# Patient Record
Sex: Female | Born: 1955 | Race: White | Hispanic: No | Smoking: Current every day smoker
Health system: Southern US, Community
[De-identification: ages and names within clinical notes are randomized; demographics above are authoritative.]

## PROBLEM LIST (undated history)

## (undated) DIAGNOSIS — F909 Attention-deficit hyperactivity disorder, unspecified type: Secondary | ICD-10-CM

## (undated) DIAGNOSIS — F319 Bipolar disorder, unspecified: Secondary | ICD-10-CM

## (undated) HISTORY — PX: BREAST BIOPSY: SHX20

## (undated) HISTORY — PX: TUBAL LIGATION: SHX77

## (undated) HISTORY — PX: ABDOMINAL HYSTERECTOMY: SHX81

## (undated) HISTORY — PX: CHOLECYSTECTOMY: SHX55

---

## 2002-12-01 ENCOUNTER — Emergency Department (HOSPITAL_COMMUNITY): Admission: EM | Admit: 2002-12-01 | Discharge: 2002-12-01 | Payer: Self-pay | Admitting: Emergency Medicine

## 2002-12-07 ENCOUNTER — Emergency Department (HOSPITAL_COMMUNITY): Admission: EM | Admit: 2002-12-07 | Discharge: 2002-12-07 | Payer: Self-pay | Admitting: Emergency Medicine

## 2002-12-30 ENCOUNTER — Inpatient Hospital Stay (HOSPITAL_COMMUNITY): Admission: AD | Admit: 2002-12-30 | Discharge: 2003-01-01 | Payer: Self-pay | Admitting: *Deleted

## 2003-07-09 ENCOUNTER — Emergency Department (HOSPITAL_COMMUNITY): Admission: EM | Admit: 2003-07-09 | Discharge: 2003-07-10 | Payer: Self-pay | Admitting: *Deleted

## 2003-07-21 ENCOUNTER — Ambulatory Visit (HOSPITAL_COMMUNITY): Admission: RE | Admit: 2003-07-21 | Discharge: 2003-07-21 | Payer: Self-pay | Admitting: Pulmonary Disease

## 2003-08-05 ENCOUNTER — Ambulatory Visit (HOSPITAL_COMMUNITY): Admission: RE | Admit: 2003-08-05 | Discharge: 2003-08-05 | Payer: Self-pay | Admitting: General Surgery

## 2004-10-11 ENCOUNTER — Ambulatory Visit (HOSPITAL_COMMUNITY): Admission: RE | Admit: 2004-10-11 | Discharge: 2004-10-11 | Payer: Self-pay | Admitting: General Surgery

## 2005-01-08 ENCOUNTER — Ambulatory Visit: Payer: Self-pay | Admitting: Psychiatry

## 2005-01-08 ENCOUNTER — Inpatient Hospital Stay (HOSPITAL_COMMUNITY): Admission: EM | Admit: 2005-01-08 | Discharge: 2005-01-11 | Payer: Self-pay | Admitting: Psychiatry

## 2005-01-18 ENCOUNTER — Ambulatory Visit: Payer: Self-pay | Admitting: Psychiatry

## 2006-06-19 ENCOUNTER — Emergency Department (HOSPITAL_COMMUNITY): Admission: EM | Admit: 2006-06-19 | Discharge: 2006-06-19 | Payer: Self-pay | Admitting: Emergency Medicine

## 2007-04-08 ENCOUNTER — Ambulatory Visit (HOSPITAL_COMMUNITY): Admission: RE | Admit: 2007-04-08 | Discharge: 2007-04-08 | Payer: Self-pay | Admitting: General Surgery

## 2008-08-05 ENCOUNTER — Ambulatory Visit (HOSPITAL_COMMUNITY): Admission: RE | Admit: 2008-08-05 | Discharge: 2008-08-05 | Payer: Self-pay | Admitting: Pulmonary Disease

## 2008-09-02 ENCOUNTER — Emergency Department (HOSPITAL_COMMUNITY): Admission: EM | Admit: 2008-09-02 | Discharge: 2008-09-02 | Payer: Self-pay | Admitting: Emergency Medicine

## 2009-08-03 ENCOUNTER — Ambulatory Visit (HOSPITAL_COMMUNITY): Admission: RE | Admit: 2009-08-03 | Discharge: 2009-08-03 | Payer: Self-pay | Admitting: Pulmonary Disease

## 2009-08-11 ENCOUNTER — Ambulatory Visit (HOSPITAL_COMMUNITY): Admission: RE | Admit: 2009-08-11 | Discharge: 2009-08-11 | Payer: Self-pay | Admitting: Pulmonary Disease

## 2010-01-30 ENCOUNTER — Encounter: Payer: Self-pay | Admitting: General Surgery

## 2010-01-31 ENCOUNTER — Encounter: Payer: Self-pay | Admitting: Pulmonary Disease

## 2010-01-31 IMAGING — CR DG SHOULDER 2+V*R*
3 series · 3 of 3 positions shown · non-contrast
Comparison: None

CLINICAL DATA: Right shoulder pain for 1 week.

RIGHT SHOULDER - 2+ VIEW

[view not recorded (1 of 3)]
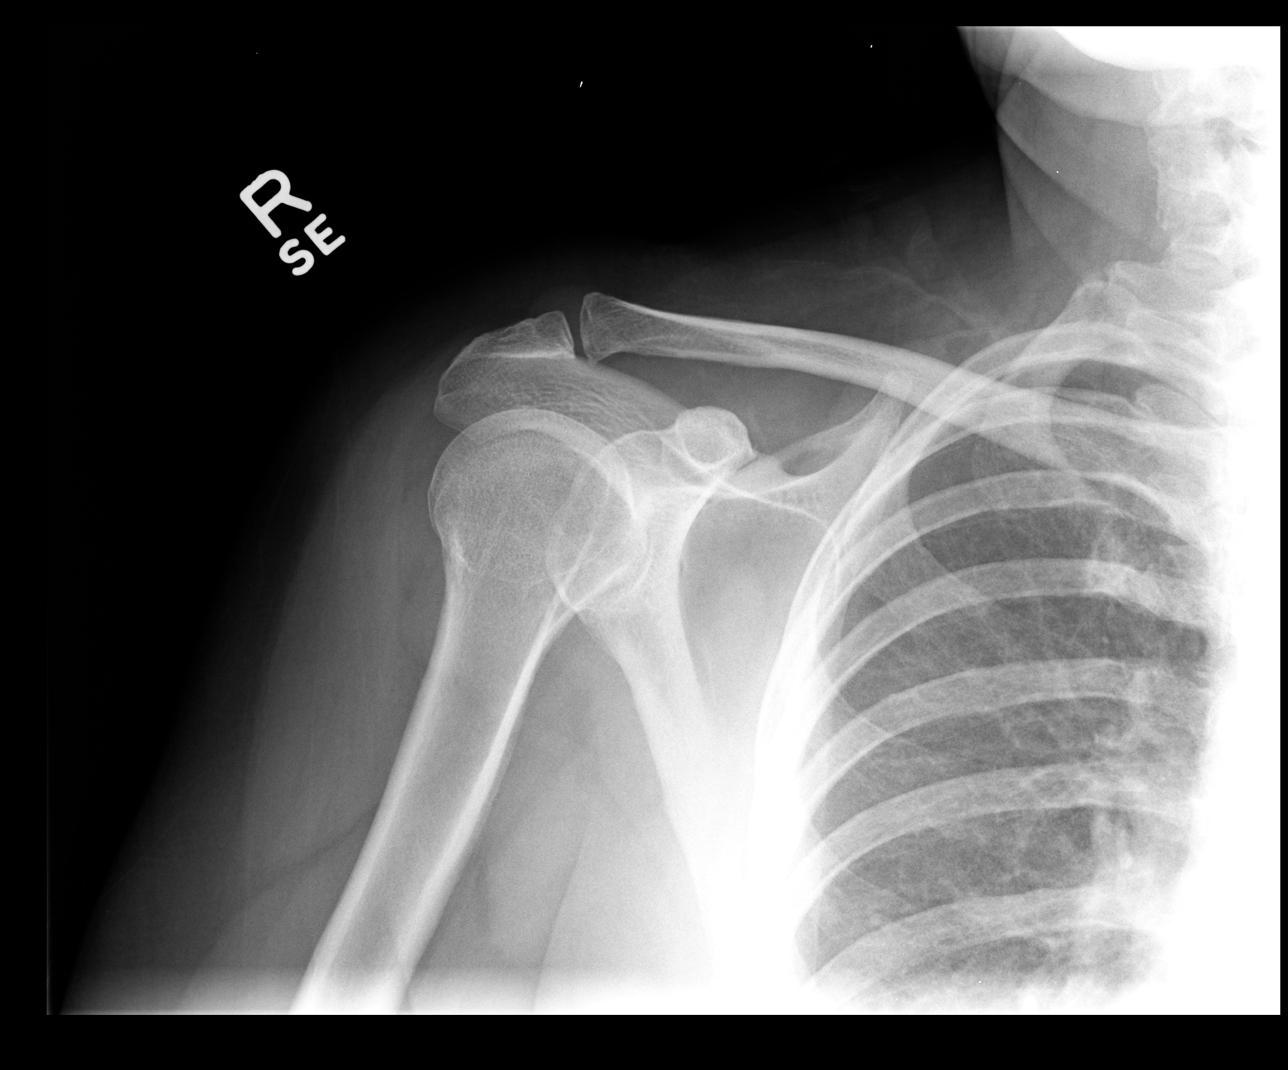

[view not recorded (2 of 3)]
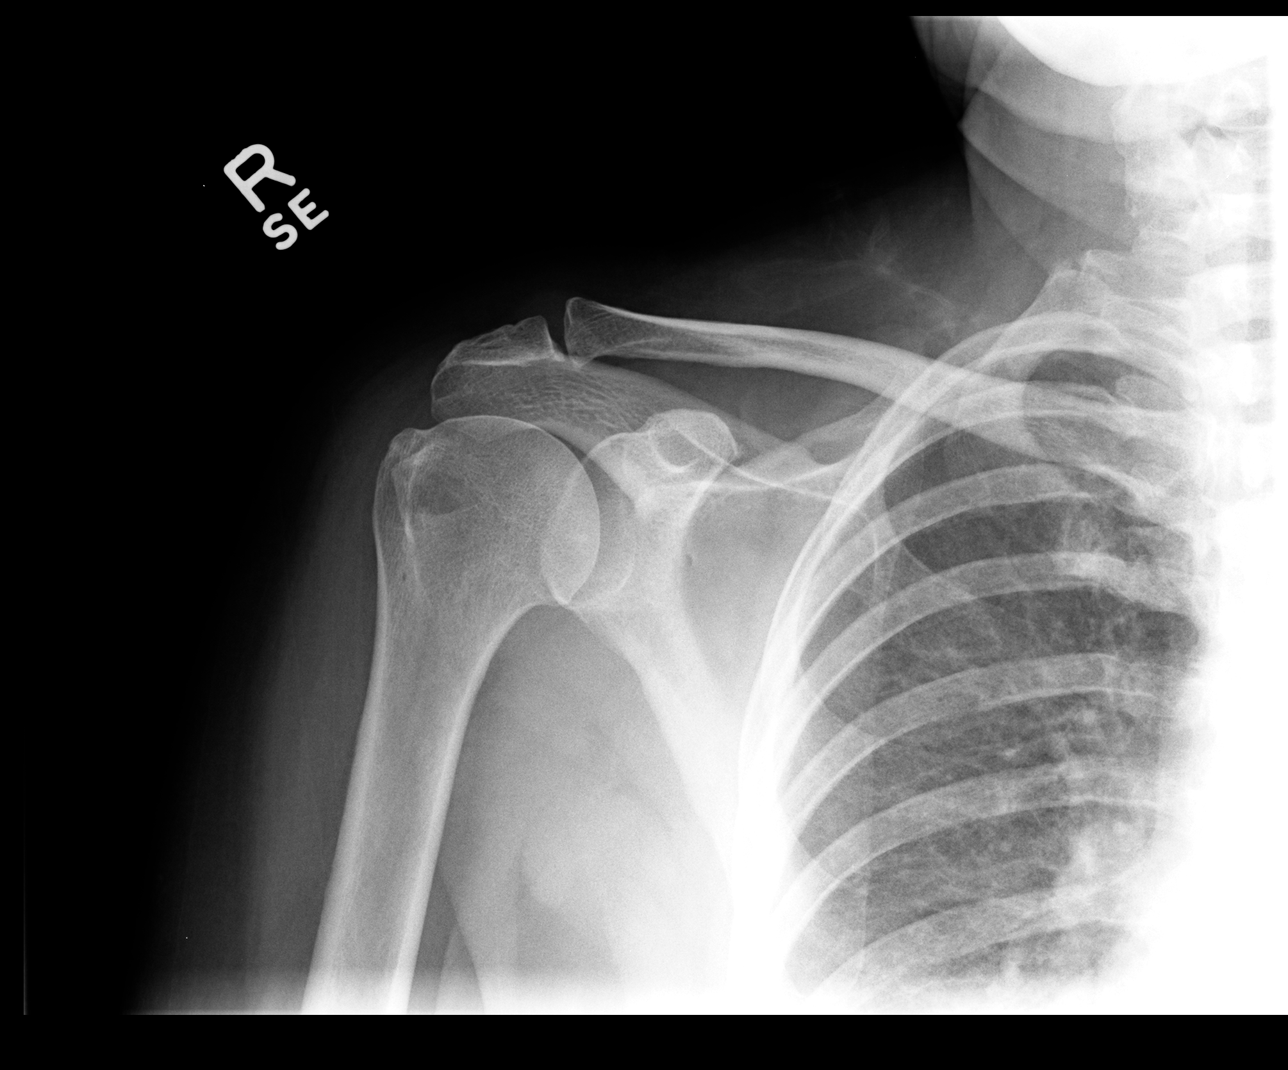

[view not recorded (3 of 3)]
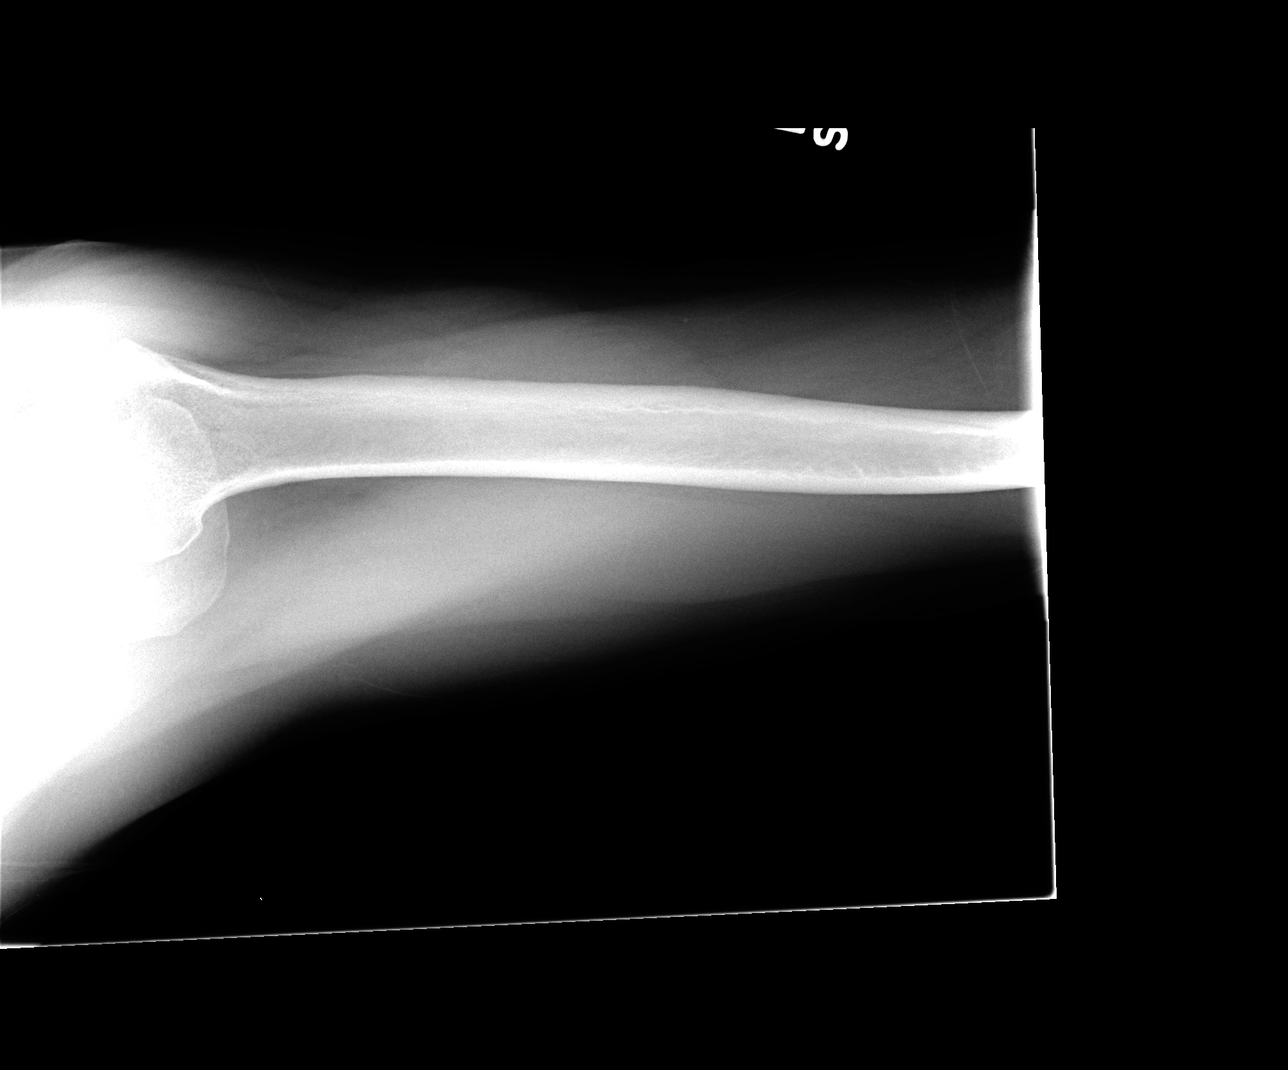

[3 of 3 positions shown; findings below may reference images not displayed]

FINDINGS: Faint lucency along the lesser tuberosity on the external
rotation view is most likely to be degenerative but could possibly
represent a small enchondroma.  This area of lucency measures
approximately 1.4 x 0.9 cm.

Glenohumeral alignment appears normal.  There is mild inferior
spurring of the acromioclavicular articulation.
IMPRESSION: 1.  Lucent lesion in the vicinity of the lesser tuberosity is
probably degenerative.
2.  No acute findings.  If symptoms persists despite conservative
therapy, MRI followup may be warranted.

## 2010-05-19 ENCOUNTER — Other Ambulatory Visit (HOSPITAL_COMMUNITY): Payer: Self-pay | Admitting: Pulmonary Disease

## 2010-05-19 ENCOUNTER — Ambulatory Visit (HOSPITAL_COMMUNITY)
Admission: RE | Admit: 2010-05-19 | Discharge: 2010-05-19 | Disposition: A | Discharge status: Home / Self Care | Payer: Medicaid Other | Source: Ambulatory Visit | Attending: Pulmonary Disease | Admitting: Pulmonary Disease

## 2010-05-19 DIAGNOSIS — M25549 Pain in joints of unspecified hand: Secondary | ICD-10-CM | POA: Insufficient documentation

## 2010-05-19 DIAGNOSIS — R52 Pain, unspecified: Secondary | ICD-10-CM
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
## 2010-05-27 NOTE — H&P (Signed)
NAMEDORRIE, COCUZZA NO.:  [id number]   MEDICAL RECORD NO.:  [id number]          PATIENT TYPE:  IPS   LOCATION:  0502                          [facility]   PHYSICIAN:  Jeanice Lim, M.D. DATE OF BIRTH:  09-09-1955   DATE OF ADMISSION:  01/08/2005  DATE OF DISCHARGE:                         PSYCHIATRIC ADMISSION ASSESSMENT   IDENTIFYING INFORMATION:  This is a 55 year old married white female who was  admitted voluntarily to the [facility] on January 07, 2005.   HISTORY OF PRESENT ILLNESS:  This patient presented to [facility] Emergency  Department after taking an overdose of 10 mg of Xanax and 50 mg of Valium in  addition to approximately 10 ounces of alcohol.  The patient reports that  she vomited after ingestion of the pills.  She denies having had suicidal  ideation.  She reports increased stressors at home, having to care for her  83 year old grandson and her husband as well as a daughter who is living in  the home now who has a drug habit.  The patient reports that she has been  depressed since the death of her mother approximately 4 years ago, with  symptoms of being sad, irritable, poor energy, anhedonia, isolation, poor  sleep, decreased appetite.  She reports that after the death of her mother  she took Zoloft, unknown dosage, for approximately 6 months with relief, and  after feeling better she tapered herself off of the medication.  She denies  any current homicidal ideation, denies any auditory or visual hallucinations  or signs or symptoms of mania.   PAST PSYCHIATRIC HISTORY:  This is her first admission to the [facility].  She has no previous inpatient or outpatient psychiatric  following, other than having been put on the Zoloft by her primary care  physician after the death of her mother.   SOCIAL HISTORY:  She lives with her husband of 14 years and her 29 year old  grandson and her 6 year old daughter who  will soon be moving out.  She is  currently unemployed.  She previously worked at a [employer], pressing  clothes.  She has a 7th grade education.  She denies any current legal  problems, and she describes her husband as her sole social support system.   FAMILY HISTORY:  She denies knowledge of any mental illness.  She reports  that her father was an alcohol abuser and her daughter has a cocaine and  heroin habit.   ALCOHOL AND DRUG HISTORY:  She says that she no longer drinks, for  approximately 3 years after gaining custody of her 5 year old grandson.  Prior to that she would drink approximately 3 beers a day on weekends and  she has tried marijuana in the past.   PAST MEDICAL HISTORY:  Her primary care Zondra Lawlor is Dr. Juanetta Gosling in  [place], [place].  Her medical problems include irritable bowel  syndrome.  She had a hysterectomy in 2004, cholecystectomy in 2004.  She has  had two breast surgeries of unknown type, one in 1996 and one in 2004, and  she  is complaining of  lumbar back pain for the past 2 months. She smokes 3  packs of cigarettes a day.   MEDICATIONS:  Her medications include Xanax, we believe is 0.5 mg 3 times  daily, Valium 5 mg twice a day, and hydrocodone 5/325 daily but she reports  not haven taken any hydrocodone for the last 3 months.   DRUG ALLERGIES:  MORPHINE, CODEINE.   POSITIVE PHYSICAL FINDINGS:  Her physical examination was performed at  [facility] Emergency Department.  Here at the [facility], she is  a well-nourished, well-developed white female who looks older than her  stated age of 44 years.  Her recent vital signs, 97.8, pulse 88,  respirations 18, blood pressure 111/78.  She is 66.5 inches tall, 161  pounds.   LABORATORY DATA:  She has elevated white blood cell count of 11.1, elevated  platelet count of 425.  Her basic metabolic panel was within normal limits  except for a low glucose reading of 67.  Her blood alcohol  level was 97.  Her salicylate was less than 4, acetaminophen less than 10.  Her urine drug  screen was positive for barbiturates and benzodiazepines.  She has a  urinalysis and TSH pending.   MENTAL STATUS EXAM:  She is alert and oriented x4 with good eye contact and  a constricted affect.  Her appearance was disheveled and her behavior was  calm and cooperative.  Her speech was clear with even pace and tone.  Her  mood was depressed as she was tearful during the interview.  Her thought  process was coherent, without suicidal or homicidal ideation.  No evidence  of auditory or visual hallucinations or mania was observed.  Her  concentration was normal and memory is intact.  Her insight is fair and her  judgment is poor.   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  AXIS III:  Irritable bowel syndrome.  AXIS IV:  Moderate for problems with her primary support group, educational  and occupational problems and economic problems.  AXIS V:  Current global assessment of functioning of 34, the past year 55.   INITIAL PLAN OF CARE:  Initial plan is to admit the patient voluntarily.  We  will work to stabilize her to mood.  We will work to increase her coping  skills and decrease her stressors.  We will start her on Zoloft and have a  family session prior to discharge.  She will need referral for followup.   ESTIMATED LENGTH OF STAY:  5-7 days.      Yolande Jolly, PA      Jeanice Lim, M.D.  Electronically Signed    AHW/MEDQ  D:  01/08/2005  T:  01/08/2005  Job:  [id number]

## 2010-05-27 NOTE — Discharge Summary (Signed)
Samantha Huynh, Samantha Huynh                 ACCOUNT NO.:  [id number]   MEDICAL RECORD NO.:  [id number]          PATIENT TYPE:  IPS   LOCATION:  0502                          [facility]ILITY[facility]   PHYSICIAN:  Jeanice Lim, M.D. DATE OF BIRTH:  Feb 23, 1955   DATE OF ADMISSION:  01/08/2005  DATE OF DISCHARGE:  01/11/2005                                 DISCHARGE SUMMARY   IDENTIFYING DATA:  This is a 55 year old married Caucasian female presenting  to the [facility] ER, overdosed on Xanax and Valium and alcohol. Reported  having vomited after ingesting pills, increased stress at home.   MEDICATIONS:  1.  Xanax.  2.  Hydrochlorothiazide.  3.  Valium.   ALLERGIES:  MORPHINE and CODEINE.   No alcohol for three years; previously three beers a day. Tried marijuana in  the past. Denied other drug use.   PRIMARY CARE PHYSICIAN:  Dr. Juanetta Gosling in [place].   PHYSICAL AND NEUROLOGICAL EXAMINATION:  Within normal limits.   ROUTINE ADMISSION LABORATORY DATA:  Within normal limits.   MENTAL STATUS EXAM:  Alert and oriented. Good eye contact. Constricted  affect. Speech clear and even. Mood depressed, tearful. Thought processes  goal directed. Cognitively intact. Somewhat fidgety psychomotor agitated and  restless at times. Judgment and insight impaired.   ADMISSION DIAGNOSES:  AXIS I:  Depressive disorder, not otherwise specified.  AXIS II:  Deferred.  AXIS III:  Irritable bowel syndrome.  AXIS IV:  Moderate stressors with primary support group, education,  occupation, economic problems.  AXIS V:  34/55.   HOSPITAL COURSE:  The patient was admitted and ordered routine p.r.n.  medications. Started on Zoloft. Family session was requested, and patient  participated in individual, group, and milieu therapy. Protonix was started.  The patient was placed on detox protocol for safe detox and optimized on  medications targeting depressive symptoms and anxiety. The patient reported  a gradual positive  response to medications and resolution of withdraw  symptoms. Discharge in improved condition, euthymic, affect bright. No side  effects with medications. Given medication education. At the time of  discharge, there was no dangerous ideation including suicidal thought and  thinking was clear and mood was euthymic, affect bright, and aftercare plan  in place.   The patient was discharged after medication education on:  1.  Zoloft 50 mg 1-1/2 q.a.m.  2.  Trazodone 150 mg at 8 p.m.  3.  Ambien 10 mg nightly p.r.n. insomnia.   The patient's followup will be with Dr. Lolly Mustache at the [facility] in [facility] on January 9 at 10:15.   DISCHARGE DIAGNOSES:  AXIS I:  Depressive disorder, not otherwise specified.  AXIS II:  Deferred.  AXIS III:  Irritable bowel syndrome.  AXIS IV:  Moderate stressors with primary support group, education,  occupation, economic problems.  AXIS V:  55 to 60.      Jeanice Lim, M.D.  Electronically Signed     JEM/MEDQ  D:  02/09/2005  T:  02/10/2005  Job:  [id number]
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
## 2010-06-28 ENCOUNTER — Other Ambulatory Visit (HOSPITAL_COMMUNITY): Payer: Self-pay | Admitting: Pulmonary Disease

## 2010-06-28 DIAGNOSIS — Z139 Encounter for screening, unspecified: Secondary | ICD-10-CM

## 2010-08-08 ENCOUNTER — Ambulatory Visit (HOSPITAL_COMMUNITY): Payer: Medicaid Other

## 2010-09-01 ENCOUNTER — Ambulatory Visit (HOSPITAL_COMMUNITY): Payer: Medicaid Other

## 2012-04-30 ENCOUNTER — Other Ambulatory Visit (HOSPITAL_COMMUNITY): Payer: Self-pay | Admitting: General Surgery

## 2012-04-30 DIAGNOSIS — Z139 Encounter for screening, unspecified: Secondary | ICD-10-CM

## 2012-05-02 ENCOUNTER — Ambulatory Visit (HOSPITAL_COMMUNITY): Payer: Medicaid Other

## 2012-06-13 ENCOUNTER — Other Ambulatory Visit (HOSPITAL_COMMUNITY): Payer: Self-pay | Admitting: Pulmonary Disease

## 2012-06-13 DIAGNOSIS — R109 Unspecified abdominal pain: Secondary | ICD-10-CM

## 2012-06-13 DIAGNOSIS — R1110 Vomiting, unspecified: Secondary | ICD-10-CM

## 2012-06-13 DIAGNOSIS — R110 Nausea: Secondary | ICD-10-CM

## 2012-06-18 ENCOUNTER — Ambulatory Visit (HOSPITAL_COMMUNITY)
Admission: RE | Admit: 2012-06-18 | Discharge: 2012-06-18 | Disposition: A | Discharge status: Home / Self Care | Payer: Medicaid Other | Source: Ambulatory Visit | Attending: Pulmonary Disease | Admitting: Pulmonary Disease

## 2012-06-18 DIAGNOSIS — R112 Nausea with vomiting, unspecified: Secondary | ICD-10-CM | POA: Insufficient documentation

## 2012-06-18 DIAGNOSIS — R1110 Vomiting, unspecified: Secondary | ICD-10-CM

## 2012-06-18 DIAGNOSIS — R109 Unspecified abdominal pain: Secondary | ICD-10-CM | POA: Insufficient documentation

## 2012-06-18 DIAGNOSIS — R110 Nausea: Secondary | ICD-10-CM

## 2013-06-25 ENCOUNTER — Encounter (HOSPITAL_COMMUNITY): Payer: Self-pay | Admitting: Emergency Medicine

## 2013-06-25 ENCOUNTER — Emergency Department (HOSPITAL_COMMUNITY)
Admission: EM | Admit: 2013-06-25 | Discharge: 2013-06-25 | Disposition: A | Discharge status: Home / Self Care | Payer: Medicaid Other | Attending: Emergency Medicine | Admitting: Emergency Medicine

## 2013-06-25 DIAGNOSIS — B009 Herpesviral infection, unspecified: Secondary | ICD-10-CM | POA: Insufficient documentation

## 2013-06-25 DIAGNOSIS — B029 Zoster without complications: Secondary | ICD-10-CM

## 2013-06-25 DIAGNOSIS — F17200 Nicotine dependence, unspecified, uncomplicated: Secondary | ICD-10-CM | POA: Insufficient documentation

## 2013-06-25 DIAGNOSIS — Z79899 Other long term (current) drug therapy: Secondary | ICD-10-CM | POA: Insufficient documentation

## 2013-06-25 DIAGNOSIS — Z8659 Personal history of other mental and behavioral disorders: Secondary | ICD-10-CM | POA: Insufficient documentation

## 2013-06-25 HISTORY — DX: Attention-deficit hyperactivity disorder, unspecified type: F90.9

## 2013-06-25 HISTORY — DX: Bipolar disorder, unspecified: F31.9

## 2013-06-25 MED ORDER — VALACYCLOVIR HCL 500 MG PO TABS
1000.0000 mg | ORAL_TABLET | Freq: Once | ORAL | Status: AC
  Administered 2013-06-25: 1000 mg via ORAL
  Filled 2013-06-25: qty 2

## 2013-06-25 MED ORDER — VALACYCLOVIR HCL 1 G PO TABS: 1000.0000 mg | ORAL_TABLET | Freq: Three times a day (TID) | ORAL | Status: AC

## 2013-06-25 MED ORDER — OXYCODONE-ACETAMINOPHEN 5-325 MG PO TABS
2.0000 | ORAL_TABLET | Freq: Once | ORAL | Status: AC
  Administered 2013-06-25: 2 via ORAL
  Filled 2013-06-25: qty 2

## 2013-06-25 MED ORDER — OXYCODONE-ACETAMINOPHEN 5-325 MG PO TABS: 2.0000 | ORAL_TABLET | ORAL | Status: DC | PRN

## 2013-06-25 NOTE — ED Notes (Signed)
Patient reports painful rash that started yesterday to back. States has spread to right side, possible shingles.

## 2013-06-25 NOTE — ED Notes (Signed)
Pt states she had a single bump a few days ago & now rash to her back & right side.

## 2013-06-25 NOTE — ED Notes (Signed)
Pt alert & oriented x4, stable gait. Patient given discharge instructions, paperwork & prescription(s). Patient  instructed to stop at the registration desk to finish any additional paperwork. Patient verbalized understanding. Pt left department w/ no further questions. 

## 2013-06-25 NOTE — Discharge Instructions (Signed)
Shingles °Shingles (herpes zoster) is an infection that is caused by the same virus that causes chickenpox (varicella). The infection causes a painful skin rash and fluid-filled blisters, which eventually break open, crust over, and heal. It may occur in any area of the body, but it usually affects only one side of the body or face. The pain of shingles usually lasts about 1 month. However, some people with shingles may develop long-term (chronic) pain in the affected area of the body. °Shingles often occurs many years after the person had chickenpox. It is more common: °· In people older than 50 years. °· In people with weakened immune systems, such as those with HIV, AIDS, or cancer. °· In people taking medicines that weaken the immune system, such as transplant medicines. °· In people under great stress. °CAUSES  °Shingles is caused by the varicella zoster virus (VZV), which also causes chickenpox. After a person is infected with the virus, it can remain in the person's body for years in an inactive state (dormant). To cause shingles, the virus reactivates and breaks out as an infection in a nerve root. °The virus can be spread from person to person (contagious) through contact with open blisters of the shingles rash. It will only spread to people who have not had chickenpox. When these people are exposed to the virus, they may develop chickenpox. They will not develop shingles. Once the blisters scab over, the person is no longer contagious and cannot spread the virus to others. °SYMPTOMS  °Shingles shows up in stages. The initial symptoms may be pain, itching, and tingling in an area of the skin. This pain is usually described as burning, stabbing, or throbbing. In a few days or weeks, a painful red rash will appear in the area where the pain, itching, and tingling were felt. The rash is usually on one side of the body in a band or belt-like pattern. Then, the rash usually turns into fluid-filled blisters. They  will scab over and dry up in approximately 2-3 weeks. °Flu-like symptoms may also occur with the initial symptoms, the rash, or the blisters. These may include: °· Fever. °· Chills. °· Headache. °· Upset stomach. °DIAGNOSIS  °Your caregiver will perform a skin exam to diagnose shingles. Skin scrapings or fluid samples may also be taken from the blisters. This sample will be examined under a microscope or sent to a lab for further testing. °TREATMENT  °There is no specific cure for shingles. Your caregiver will likely prescribe medicines to help you manage the pain, recover faster, and avoid long-term problems. This may include antiviral drugs, anti-inflammatory drugs, and pain medicines. °HOME CARE INSTRUCTIONS  °· Take a cool bath or apply cool compresses to the area of the rash or blisters as directed. This may help with the pain and itching.   °· Only take over-the-counter or prescription medicines as directed by your caregiver.   °· Rest as directed by your caregiver. °· Keep your rash and blisters clean with mild soap and cool water or as directed by your caregiver.  °· Do not pick your blisters or scratch your rash. Apply an anti-itch cream or numbing creams to the affected area as directed by your caregiver. °· Keep your shingles rash covered with a loose bandage (dressing). °· Avoid skin contact with: °¨ Babies.   °¨ Pregnant women.   °¨ Children with eczema.   °¨ Elderly people with transplants.   °¨ People with chronic illnesses, such as leukemia or AIDS.   °· Wear loose-fitting clothing to help ease the   pain of material rubbing against the rash. °· Keep all follow-up appointments with your caregiver. If the area involved is on your face, you may receive a referral for follow-up to a specialist, such as an eye doctor (ophthalmologist) or an ear, nose, and throat (ENT) doctor. Keeping all follow-up appointments will help you avoid eye complications, chronic pain, or disability.   °SEEK IMMEDIATE MEDICAL  CARE IF:  °· You have facial pain, pain around the eye area, or loss of feeling on one side of your face. °· You have ear pain or ringing in your ear. °· You have loss of taste. °· Your pain is not relieved with prescribed medicines.   °· Your redness or swelling spreads.   °· You have more pain and swelling.  °· Your condition is worsening or has changed.   °· You have a fever or persistent symptoms for more than 2-3 days. °· You have a fever and your symptoms suddenly get worse. °MAKE SURE YOU: °· Understand these instructions. °· Will watch your condition. °· Will get help right away if you are not doing well or get worse. °Document Released: 12/26/2004 Document Revised: 09/20/2011 Document Reviewed: 08/10/2011 °[employer]® Patient Information ©2015 [employer]. This information is not intended to replace advice given to you by your health care provider. Make sure you discuss any questions you have with your health care provider. ° °

## 2013-06-25 NOTE — ED Provider Notes (Signed)
CSN: [id number]     Arrival date & time 06/25/13  1936 History  This chart was scribed for Samantha OctaveStephen Rancour, MD by Milly JakobJohn Lee Graves, ED Scribe. The patient was seen in room APA10/APA10. Patient's care was started at 9:58 PM.    Chief Complaint  Patient presents with  . Rash  . Herpes Zoster   The history is provided by the patient. No language interpreter was used.   HPI Comments: Horald PollenLois P Giuffre is a 58 y.o. female who presents to the Emergency Department complaining of a painful itchy rash on her back that spreads around to the front onset two days ago. She reports a fever of 101 and a dry cough. She denies history of shingles. She denies chest pain, nausea, vomiting, SOB, and other associated symptoms. She denies allergies to medication. She denies history of DM, heart problems, and lung problems.  She has a history of Bipolar Disorder and is Manic Depressive.   Past Medical History  Diagnosis Date  . Manic depression   . ADHD (attention deficit hyperactivity disorder)    Past Surgical History  Procedure Laterality Date  . Cholecystectomy    . Abdominal hysterectomy    . Breast biopsy     History reviewed. No pertinent family history. History  Substance Use Topics  . Smoking status: Current Every Day Smoker  . Smokeless tobacco: Not on file  . Alcohol Use: No   OB History   Grav Para Term Preterm Abortions TAB SAB Ect Mult Living                 Review of Systems A complete 10 system review of systems was obtained and all systems are negative except as noted in the HPI and PMH.     Allergies  Review of patient's allergies indicates no known allergies.  Home Medications   Prior to Admission medications   Medication Sig Start Date End Date Taking? Authorizing Eileen Kangas  oxyCODONE-acetaminophen (PERCOCET/ROXICET) 5-325 MG per tablet Take 2 tablets by mouth every 4 (four) hours as needed for severe pain. 06/25/13   Samantha OctaveStephen Rancour, MD  valACYclovir (VALTREX) 1000 MG tablet  Take 1 tablet (1,000 mg total) by mouth 3 (three) times daily. 06/25/13 07/09/13  Samantha OctaveStephen Rancour, MD   Triage Vitals: BP 145/62  Pulse 96  Temp(Src) 98.4 F (36.9 C) (Oral)  Resp 20  Ht 5\' 1"  (1.549 m)  Wt 125 lb (56.7 kg)  BMI 23.63 kg/m2  SpO2 97% Physical Exam  Nursing note and vitals reviewed. Constitutional: She is oriented to person, place, and time. She appears well-developed and well-nourished. No distress.  HENT:  Head: Normocephalic and atraumatic.  Mouth/Throat: Oropharynx is clear and moist. No oropharyngeal exudate.  Eyes: Conjunctivae and EOM are normal. Pupils are equal, round, and reactive to light.  Neck: Normal range of motion. Neck supple.  No meningismus.  Cardiovascular: Normal rate, regular rhythm, normal heart sounds and intact distal pulses.   No murmur heard. Pulmonary/Chest: Effort normal and breath sounds normal. No respiratory distress.  Abdominal: Soft. There is no tenderness. There is no rebound and no guarding.  Musculoskeletal: Normal range of motion. She exhibits no edema and no tenderness.  Neurological: She is alert and oriented to person, place, and time. No cranial nerve deficit. She exhibits normal muscle tone. Coordination normal.  No ataxia on finger to nose bilaterally. No pronator drift. 5/5 strength throughout. CN 2-12 intact. Negative Romberg. Equal grip strength. Sensation intact. Gait is normal.   Skin: Skin  is warm and dry. Rash noted.  Vesicular erythematous rash to the right ribcage in dermatomal distribution. It does not cross midline.   Psychiatric: She has a normal mood and affect. Her behavior is normal.    ED Course  Procedures (including critical care time) DIAGNOSTIC STUDIES: Oxygen Saturation is 97% on room air, normal by my interpretation.    COORDINATION OF CARE: 10:03 PM-Discussed treatment plan with pt at bedside and pt agreed to plan.   Labs Review Labs Reviewed - No data to display  Imaging Review No results  found.   EKG Interpretation None      MDM   Final diagnoses:  Herpes zoster   Vesicular rash to right thorax in dermatomal distribution. It does not cross midline. No chest pain or shortness of breath.  Clinically has herpes zoster. We'll treat with anti-viral. Also given pain medication. No role for steroids. Instructed to avoid exposure to pregnant women and small children.   I personally performed the services described in this documentation, which was scribed in my presence. The recorded information has been reviewed and is accurate.   Samantha OctaveStephen Rancour, MD 06/26/13 [phone]

## 2014-03-30 ENCOUNTER — Emergency Department (HOSPITAL_COMMUNITY)
Admission: EM | Admit: 2014-03-30 | Discharge: 2014-03-31 | Disposition: A | Discharge status: Home / Self Care | Payer: Medicaid Other | Attending: Emergency Medicine | Admitting: Emergency Medicine

## 2014-03-30 ENCOUNTER — Encounter (HOSPITAL_COMMUNITY): Payer: Self-pay | Admitting: *Deleted

## 2014-03-30 DIAGNOSIS — F329 Major depressive disorder, single episode, unspecified: Secondary | ICD-10-CM | POA: Insufficient documentation

## 2014-03-30 DIAGNOSIS — M2042 Other hammer toe(s) (acquired), left foot: Secondary | ICD-10-CM | POA: Diagnosis not present

## 2014-03-30 DIAGNOSIS — Z720 Tobacco use: Secondary | ICD-10-CM | POA: Diagnosis not present

## 2014-03-30 DIAGNOSIS — M79672 Pain in left foot: Secondary | ICD-10-CM | POA: Diagnosis not present

## 2014-03-30 DIAGNOSIS — Z79899 Other long term (current) drug therapy: Secondary | ICD-10-CM | POA: Diagnosis not present

## 2014-03-30 DIAGNOSIS — M79671 Pain in right foot: Secondary | ICD-10-CM | POA: Diagnosis present

## 2014-03-30 DIAGNOSIS — M2041 Other hammer toe(s) (acquired), right foot: Secondary | ICD-10-CM | POA: Insufficient documentation

## 2014-03-30 MED ORDER — TRAMADOL HCL 50 MG PO TABS
50.0000 mg | ORAL_TABLET | Freq: Once | ORAL | Status: AC
  Administered 2014-03-31: 50 mg via ORAL
  Filled 2014-03-30: qty 1

## 2014-03-30 MED ORDER — IBUPROFEN 800 MG PO TABS
800.0000 mg | ORAL_TABLET | Freq: Once | ORAL | Status: AC
Start: 2014-03-31 — End: 2014-03-31
  Administered 2014-03-31: 800 mg via ORAL
  Filled 2014-03-30: qty 1

## 2014-03-30 MED ORDER — OXYCODONE-ACETAMINOPHEN 5-325 MG PO TABS: 1.0000 | ORAL_TABLET | Freq: Once | ORAL | Status: DC

## 2014-03-30 MED ORDER — TRAMADOL HCL 50 MG PO TABS: 50.0000 mg | ORAL_TABLET | Freq: Four times a day (QID) | ORAL | Status: DC | PRN

## 2014-03-30 MED ORDER — IBUPROFEN 800 MG PO TABS: 800.0000 mg | ORAL_TABLET | Freq: Three times a day (TID) | ORAL | Status: DC

## 2014-03-30 NOTE — ED Notes (Signed)
Pt reporting pain in both feet.  States that she was supposed have x-rays, but has not scheduled them.

## 2014-03-30 NOTE — ED Provider Notes (Signed)
CSN: [id number]     Arrival date & time 03/30/14  2124 History   First MD Initiated Contact with Patient 03/30/14 2335     Chief Complaint  Patient presents with  . Foot Pain     (Consider location/radiation/quality/duration/timing/severity/associated sxs/prior Treatment) HPI   Samantha Huynh is a 59 y.o. female who presents to the Emergency Department complaining of bilateral foot pain for "a really long time."  She states that she has always had "problems" with her feet and has seen her PMD for this and is waiting for a podiatry referral.  Tonight, she states the pain became severe to the ball of her foot and to her toes.  Pain is present in both feet, but worse on the right.  Pain also worse with weight bearing.  She is prescribed 7.5 mg hydrocodone by her PMD which she states is not helping control the pain.  She was told that she needs x-ray of her feet, but has not scheduled it yet. She denies redness, swelling, numbness, recent injury or pain proximal to her feet.      Past Medical History  Diagnosis Date  . Manic depression   . ADHD (attention deficit hyperactivity disorder)    Past Surgical History  Procedure Laterality Date  . Cholecystectomy    . Abdominal hysterectomy    . Breast biopsy     History reviewed. No pertinent family history. History  Substance Use Topics  . Smoking status: Current Every Day Smoker -- 1.00 packs/day  . Smokeless tobacco: Not on file  . Alcohol Use: No   OB History    No data available     Review of Systems  Constitutional: Negative for fever and chills.  Musculoskeletal: Positive for arthralgias (bilateral feet). Negative for joint swelling.  Skin: Negative for color change and wound.  Neurological: Negative for weakness and numbness.  All other systems reviewed and are negative.     Allergies  Review of patient's allergies indicates no known allergies.  Home Medications   Prior to Admission medications   Medication Sig  Start Date End Date Taking? Authorizing Provider  ALPRAZolam Prudy Feeler(XANAX) 1 MG tablet Take 1 mg by mouth at bedtime as needed for anxiety.   Yes Historical Provider, MD  HYDROcodone-acetaminophen (NORCO) 7.5-325 MG per tablet Take 1 tablet by mouth every 6 (six) hours as needed for moderate pain.   Yes Historical Provider, MD  sertraline (ZOLOFT) 50 MG tablet Take 50 mg by mouth daily.   Yes Historical Provider, MD  ibuprofen (ADVIL,MOTRIN) 800 MG tablet Take 1 tablet (800 mg total) by mouth 3 (three) times daily. Take with food 03/30/14   Tammi Darice Vicario, PA-C  traMADol (ULTRAM) 50 MG tablet Take 1 tablet (50 mg total) by mouth every 6 (six) hours as needed. 03/30/14   Tammi Jasiel Belisle, PA-C   BP 168/68 mmHg  Pulse 87  Temp(Src) 97.7 F (36.5 C) (Oral)  Resp 20  Ht 5\' 1"  (1.549 m)  Wt 140 lb 9.6 oz (63.776 kg)  BMI 26.58 kg/m2  SpO2 100% Physical Exam  Constitutional: She is oriented to person, place, and time. She appears well-developed and well-nourished. No distress.  HENT:  Head: Normocephalic and atraumatic.  Cardiovascular: Normal rate, regular rhythm, normal heart sounds and intact distal pulses.   No murmur heard. Pulmonary/Chest: Effort normal and breath sounds normal. No respiratory distress.  Musculoskeletal: She exhibits tenderness. She exhibits no edema.  Patient with pes cavus of the bilateral feet with tenderness of the  plantar surface.  Hammer toes also present.  DP pulse and distal sensation intact.  No tenderness proximally.  No erythema or FB's  Neurological: She is alert and oriented to person, place, and time. She exhibits normal muscle tone. Coordination normal.  Skin: Skin is warm and dry.  Nursing note and vitals reviewed.   ED Course  Procedures (including critical care time) Labs Review Labs Reviewed - No data to display  Imaging Review No results found.   EKG Interpretation None      MDM   Final diagnoses:  Foot pain, bilateral  Acquired hammer toes of  both feet    Pt is well appearing, likely acute on chronic pain to bilateral feet likely related to hammer toes.  No concerning sx's for cellulitis or FB, NV intact.   Pt is waiting on referral to podiatry from her PMD.  XR were offered, but pt prefers to have imaging done at podiatrists office.  Referral info given, she appears stable for d/c    Severiano Gilbert, PA-C 03/31/14 0119  Devoria Albe, MD 03/31/14 [phone]

## 2014-05-04 ENCOUNTER — Other Ambulatory Visit (HOSPITAL_COMMUNITY): Admission: RE | Admit: 2014-05-04 | Payer: Self-pay | Source: Ambulatory Visit | Admitting: Pulmonary Disease

## 2014-07-20 ENCOUNTER — Other Ambulatory Visit (HOSPITAL_COMMUNITY): Payer: Self-pay | Admitting: General Surgery

## 2014-07-20 DIAGNOSIS — Z1231 Encounter for screening mammogram for malignant neoplasm of breast: Secondary | ICD-10-CM

## 2014-07-29 ENCOUNTER — Ambulatory Visit (HOSPITAL_COMMUNITY): Payer: Medicaid Other

## 2015-08-19 ENCOUNTER — Other Ambulatory Visit (HOSPITAL_COMMUNITY): Payer: Self-pay | Admitting: Pulmonary Disease

## 2015-08-19 ENCOUNTER — Ambulatory Visit (HOSPITAL_COMMUNITY)
Admission: RE | Admit: 2015-08-19 | Discharge: 2015-08-19 | Disposition: A | Discharge status: Home / Self Care | Payer: Medicaid Other | Source: Ambulatory Visit | Attending: Pulmonary Disease | Admitting: Pulmonary Disease

## 2015-08-19 DIAGNOSIS — R937 Abnormal findings on diagnostic imaging of other parts of musculoskeletal system: Secondary | ICD-10-CM | POA: Insufficient documentation

## 2015-08-19 DIAGNOSIS — M25571 Pain in right ankle and joints of right foot: Secondary | ICD-10-CM | POA: Diagnosis present

## 2015-08-19 DIAGNOSIS — M19071 Primary osteoarthritis, right ankle and foot: Secondary | ICD-10-CM | POA: Diagnosis not present

## 2015-10-14 ENCOUNTER — Telehealth: Payer: Self-pay | Admitting: Orthopedic Surgery

## 2015-10-14 NOTE — Telephone Encounter (Signed)
After several unsuccessful attempts to contact this patient to schedule an appointment in this office, I have sent a letter back to the PCP informing them of this and also that we no longer will keep the patient's referral notes in our possession.   °

## 2016-09-01 ENCOUNTER — Ambulatory Visit (HOSPITAL_COMMUNITY)
Admission: RE | Admit: 2016-09-01 | Discharge: 2016-09-01 | Disposition: A | Discharge status: Home / Self Care | Payer: Medicaid Other | Source: Ambulatory Visit | Attending: Pulmonary Disease | Admitting: Pulmonary Disease

## 2016-09-01 ENCOUNTER — Other Ambulatory Visit (HOSPITAL_COMMUNITY): Payer: Self-pay | Admitting: Pulmonary Disease

## 2016-09-01 DIAGNOSIS — J40 Bronchitis, not specified as acute or chronic: Secondary | ICD-10-CM | POA: Diagnosis not present

## 2018-07-21 ENCOUNTER — Emergency Department (HOSPITAL_COMMUNITY)
Admission: EM | Admit: 2018-07-21 | Discharge: 2018-07-21 | Disposition: A | Discharge status: Home / Self Care | Payer: Medicaid Other | Attending: Emergency Medicine | Admitting: Emergency Medicine

## 2018-07-21 ENCOUNTER — Encounter (HOSPITAL_COMMUNITY): Payer: Self-pay | Admitting: *Deleted

## 2018-07-21 ENCOUNTER — Emergency Department (HOSPITAL_COMMUNITY): Payer: Medicaid Other

## 2018-07-21 ENCOUNTER — Other Ambulatory Visit: Payer: Self-pay

## 2018-07-21 DIAGNOSIS — Y929 Unspecified place or not applicable: Secondary | ICD-10-CM | POA: Diagnosis not present

## 2018-07-21 DIAGNOSIS — S80211A Abrasion, right knee, initial encounter: Secondary | ICD-10-CM | POA: Insufficient documentation

## 2018-07-21 DIAGNOSIS — Y939 Activity, unspecified: Secondary | ICD-10-CM | POA: Diagnosis not present

## 2018-07-21 DIAGNOSIS — L089 Local infection of the skin and subcutaneous tissue, unspecified: Secondary | ICD-10-CM | POA: Diagnosis not present

## 2018-07-21 DIAGNOSIS — S8001XA Contusion of right knee, initial encounter: Secondary | ICD-10-CM | POA: Insufficient documentation

## 2018-07-21 DIAGNOSIS — Y999 Unspecified external cause status: Secondary | ICD-10-CM | POA: Diagnosis not present

## 2018-07-21 DIAGNOSIS — Z79899 Other long term (current) drug therapy: Secondary | ICD-10-CM | POA: Insufficient documentation

## 2018-07-21 DIAGNOSIS — F17210 Nicotine dependence, cigarettes, uncomplicated: Secondary | ICD-10-CM | POA: Diagnosis not present

## 2018-07-21 DIAGNOSIS — S8991XA Unspecified injury of right lower leg, initial encounter: Secondary | ICD-10-CM | POA: Diagnosis present

## 2018-07-21 MED ORDER — CEPHALEXIN 500 MG PO CAPS
500.0000 mg | ORAL_CAPSULE | Freq: Once | ORAL | Status: AC
  Administered 2018-07-21: 500 mg via ORAL
  Filled 2018-07-21: qty 1

## 2018-07-21 MED ORDER — CEPHALEXIN 250 MG PO CAPS: 250.0000 mg | ORAL_CAPSULE | Freq: Four times a day (QID) | ORAL | 0 refills | Status: DC

## 2018-07-21 MED ORDER — IBUPROFEN 400 MG PO TABS
400.0000 mg | ORAL_TABLET | Freq: Once | ORAL | Status: AC
  Administered 2018-07-21: 400 mg via ORAL
  Filled 2018-07-21: qty 1

## 2018-07-21 MED ORDER — TRAMADOL HCL 50 MG PO TABS: 50.0000 mg | ORAL_TABLET | Freq: Four times a day (QID) | ORAL | 0 refills | Status: DC | PRN

## 2018-07-21 MED ORDER — ONDANSETRON HCL 4 MG PO TABS
4.0000 mg | ORAL_TABLET | Freq: Once | ORAL | Status: AC
  Administered 2018-07-21: 4 mg via ORAL
  Filled 2018-07-21: qty 1

## 2018-07-21 MED ORDER — ACETAMINOPHEN 500 MG PO TABS
1000.0000 mg | ORAL_TABLET | Freq: Once | ORAL | Status: AC
  Administered 2018-07-21: 1000 mg via ORAL
  Filled 2018-07-21: qty 2

## 2018-07-21 NOTE — Discharge Instructions (Signed)
Your xray of the right knee is negative for fracture or dislocation, or foreign body. Please cleanse the wound daily with soap and water. Use keflex with each meal and at bed time (four times daily). Use tylenol extra strength every 4 hours for soreness. Use Ultram for more severe pain. This medication may cause drowsiness. Please do not drink, drive, or participate in activity that requires concentration while taking this medication. Please follow up with your primary MD in 5 to 7 days.
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
## 2018-07-21 NOTE — ED Notes (Signed)
Non stick telfa dressing applied and wrapped with ace wrap.

## 2018-08-20 ENCOUNTER — Emergency Department (HOSPITAL_COMMUNITY): Payer: Medicaid Other

## 2018-08-20 ENCOUNTER — Encounter (HOSPITAL_COMMUNITY): Payer: Self-pay | Admitting: Emergency Medicine

## 2018-08-20 ENCOUNTER — Emergency Department (HOSPITAL_COMMUNITY)
Admission: EM | Admit: 2018-08-20 | Discharge: 2018-08-20 | Disposition: A | Discharge status: Home / Self Care | Payer: Medicaid Other | Attending: Emergency Medicine | Admitting: Emergency Medicine

## 2018-08-20 ENCOUNTER — Other Ambulatory Visit: Payer: Self-pay

## 2018-08-20 DIAGNOSIS — S81001D Unspecified open wound, right knee, subsequent encounter: Secondary | ICD-10-CM | POA: Diagnosis present

## 2018-08-20 DIAGNOSIS — Z79899 Other long term (current) drug therapy: Secondary | ICD-10-CM | POA: Insufficient documentation

## 2018-08-20 DIAGNOSIS — Z5189 Encounter for other specified aftercare: Secondary | ICD-10-CM | POA: Insufficient documentation

## 2018-08-20 DIAGNOSIS — T1490XA Injury, unspecified, initial encounter: Secondary | ICD-10-CM

## 2018-08-20 DIAGNOSIS — F17210 Nicotine dependence, cigarettes, uncomplicated: Secondary | ICD-10-CM | POA: Diagnosis not present

## 2018-08-20 LAB — CBC WITH DIFFERENTIAL/PLATELET
Abs Immature Granulocytes: 0.01 10*3/uL (ref 0.00–0.07)
Basophils Absolute: 0.1 10*3/uL (ref 0.0–0.1)
Basophils Relative: 1 %
Eosinophils Absolute: 0.3 10*3/uL (ref 0.0–0.5)
Eosinophils Relative: 3 %
HCT: 37.2 % (ref 36.0–46.0)
Hemoglobin: 12.2 g/dL (ref 12.0–15.0)
Immature Granulocytes: 0 %
Lymphocytes Relative: 41 %
Lymphs Abs: 3.8 10*3/uL (ref 0.7–4.0)
MCH: 29 pg (ref 26.0–34.0)
MCHC: 32.8 g/dL (ref 30.0–36.0)
MCV: 88.6 fL (ref 80.0–100.0)
Monocytes Absolute: 0.6 10*3/uL (ref 0.1–1.0)
Monocytes Relative: 6 %
Neutro Abs: 4.6 10*3/uL (ref 1.7–7.7)
Neutrophils Relative %: 49 %
Platelets: 361 10*3/uL (ref 150–400)
RBC: 4.2 MIL/uL (ref 3.87–5.11)
RDW: 14 % (ref 11.5–15.5)
WBC: 9.4 10*3/uL (ref 4.0–10.5)
nRBC: 0 % (ref 0.0–0.2)

## 2018-08-20 LAB — BASIC METABOLIC PANEL
Anion gap: 10 (ref 5–15)
BUN: 7 mg/dL — ABNORMAL LOW (ref 8–23)
CO2: 26 mmol/L (ref 22–32)
Calcium: 9.9 mg/dL (ref 8.9–10.3)
Chloride: 103 mmol/L (ref 98–111)
Creatinine, Ser: 0.66 mg/dL (ref 0.44–1.00)
GFR calc Af Amer: >60 mL/min (ref >60)
GFR calc non Af Amer: >60 mL/min (ref >60)
Glucose, Bld: 88 mg/dL (ref 70–99)
Potassium: 4.2 mmol/L (ref 3.5–5.1)
Sodium: 139 mmol/L (ref 135–145)

## 2018-08-20 MED ORDER — DOXYCYCLINE HYCLATE 100 MG PO CAPS: 100.0000 mg | ORAL_CAPSULE | Freq: Two times a day (BID) | ORAL | 0 refills | Status: DC

## 2018-08-20 MED ORDER — STERILE WATER FOR INJECTION IJ SOLN
INTRAMUSCULAR | Status: AC
  Administered 2018-08-20: 2.1 mL
  Filled 2018-08-20: qty 10

## 2018-08-20 MED ORDER — CEFTRIAXONE SODIUM 1 G IJ SOLR
1.0000 g | Freq: Once | INTRAMUSCULAR | Status: AC
Start: 2018-08-20 — End: 2018-08-20
  Administered 2018-08-20: 1 g via INTRAMUSCULAR
  Filled 2018-08-20: qty 10
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
## 2018-08-20 NOTE — Discharge Instructions (Addendum)
Your complete blood count is within normal limits.  Your chemistries and electrolytes are also within normal limits.  Your temperature is 98.1, and your heart rate is 92.  Your x-ray is negative for fracture, dislocation, foreign body, or gas related to infection.  We do see some soft tissue swelling near the 2 open wounds.  Please cleanse the wounds with soap and water.  Apply Band-Aid until these areas have improved.  You were treated tonight with an injection of an antibiotic called Rocephin. Use doxycycline 2 times daily with food. Please see Dr. Luan Pulling for follow-up of this knee wound.
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
## 2020-01-18 IMAGING — DX RIGHT KNEE - COMPLETE 4+ VIEW
4 series · 4 of 4 positions shown · non-contrast
Comparison: 07/21/2018

CLINICAL DATA: Soft tissue injury several weeks ago with persistent
wound, initial encounter

EXAM:
RIGHT KNEE - COMPLETE 4+ VIEW

[knee ap (1 of 3)]
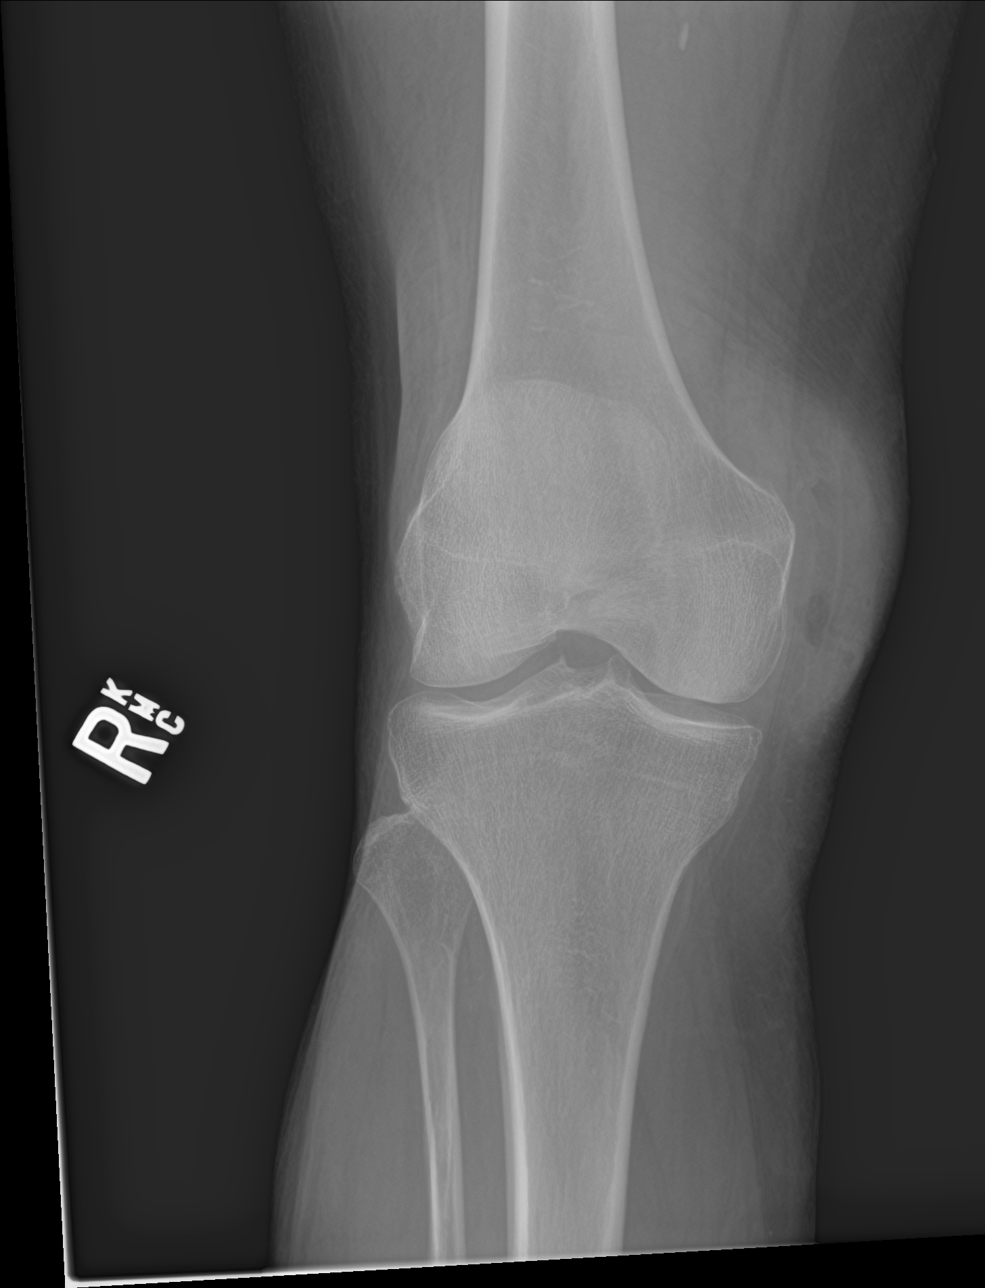

[knee lat]
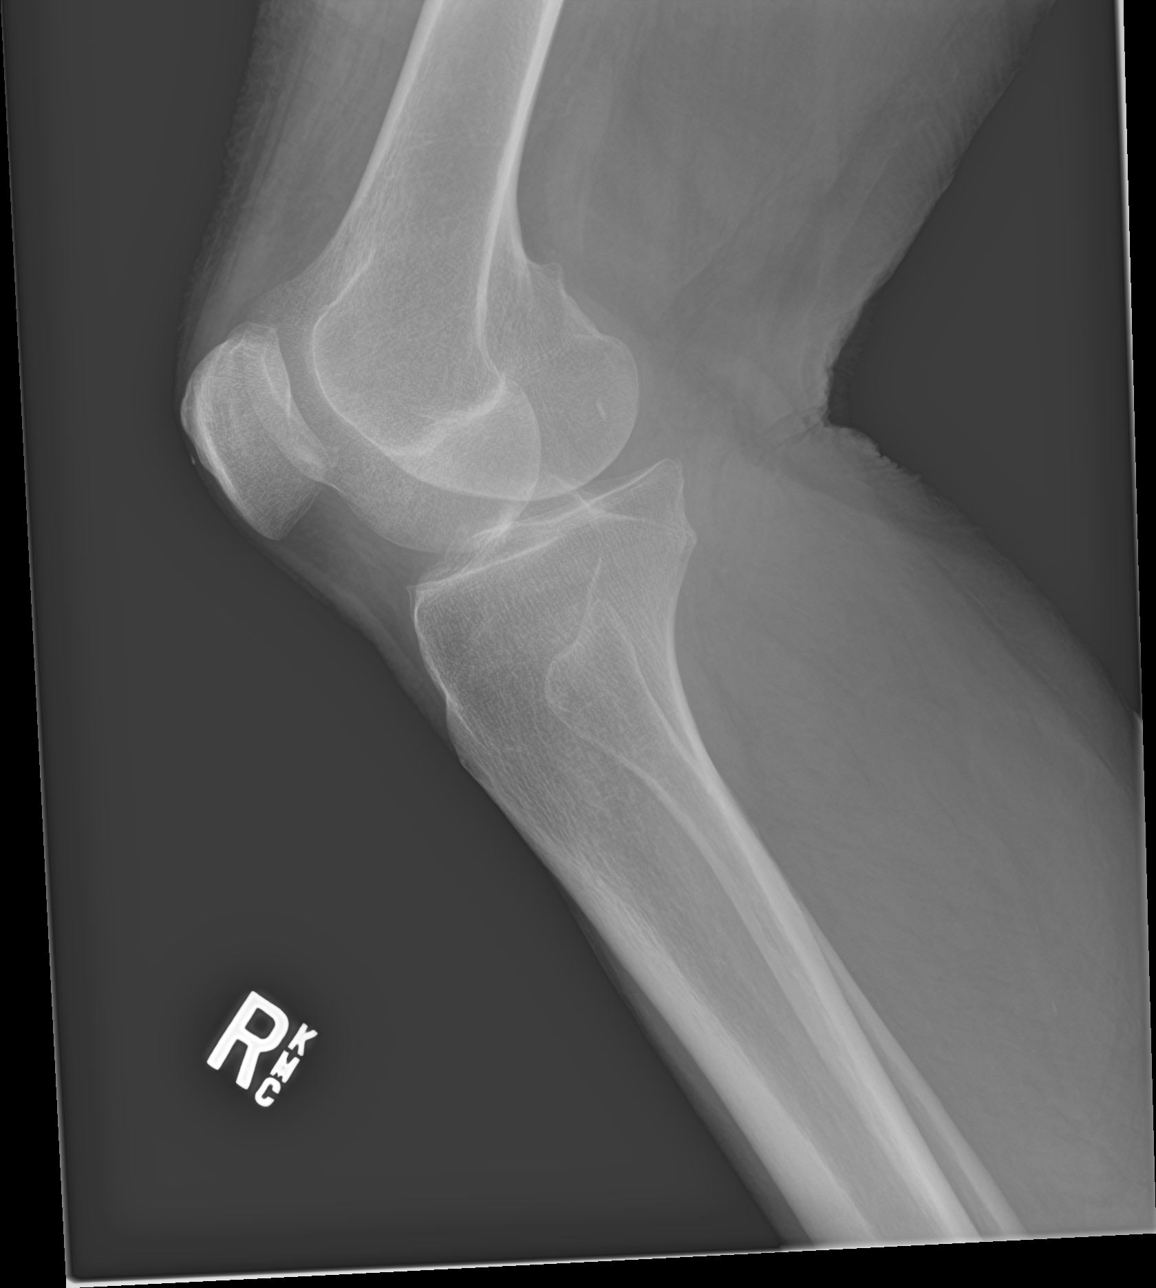

[knee ap (2 of 3)]
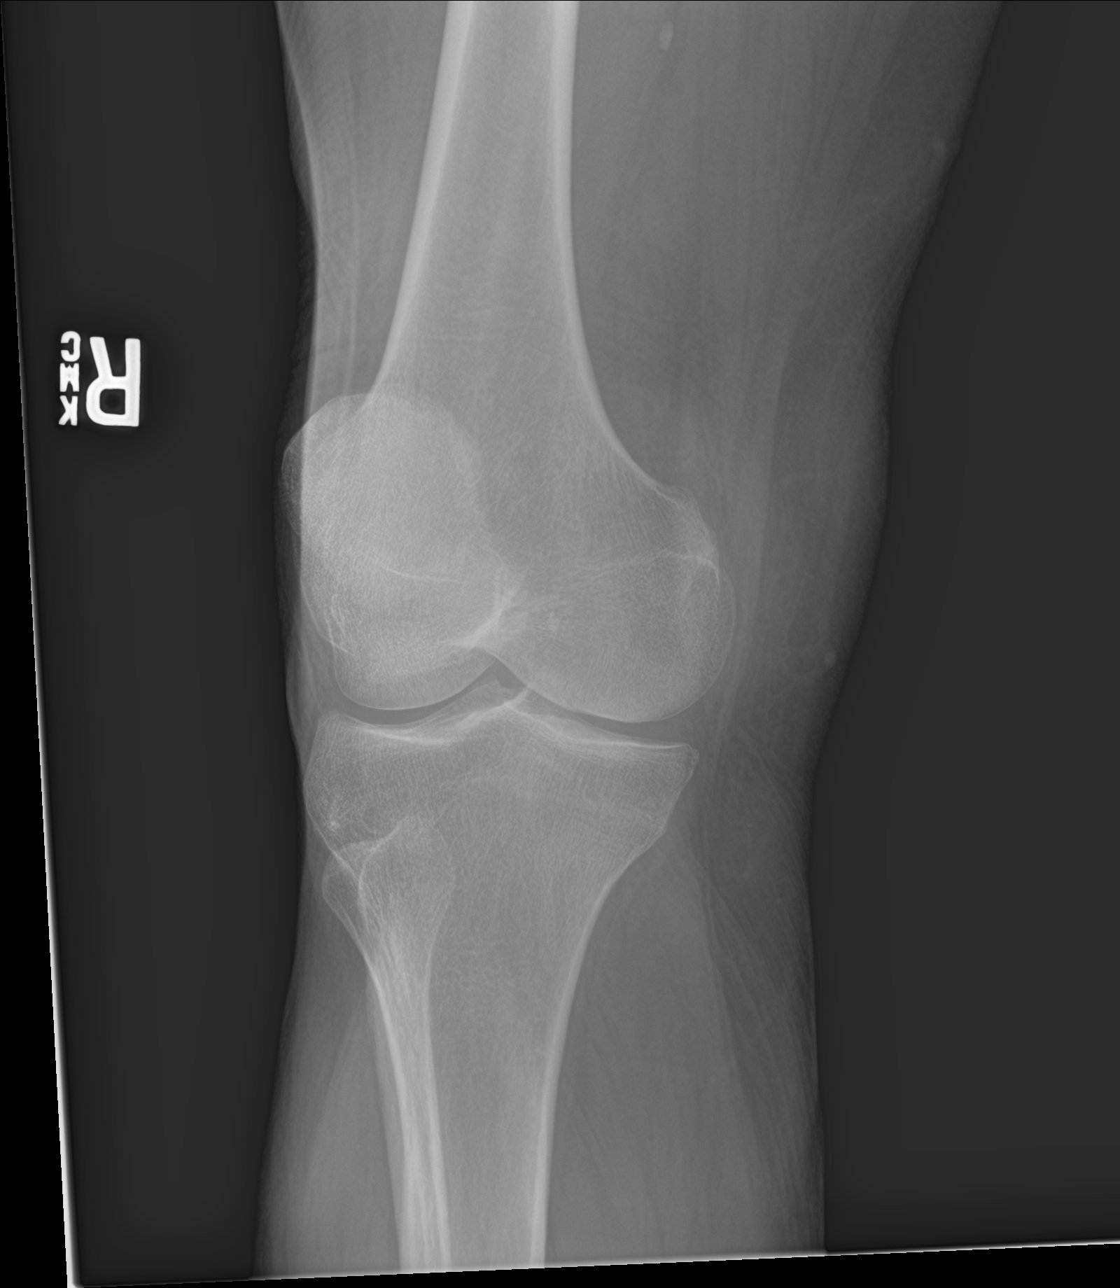

[knee ap (3 of 3)]
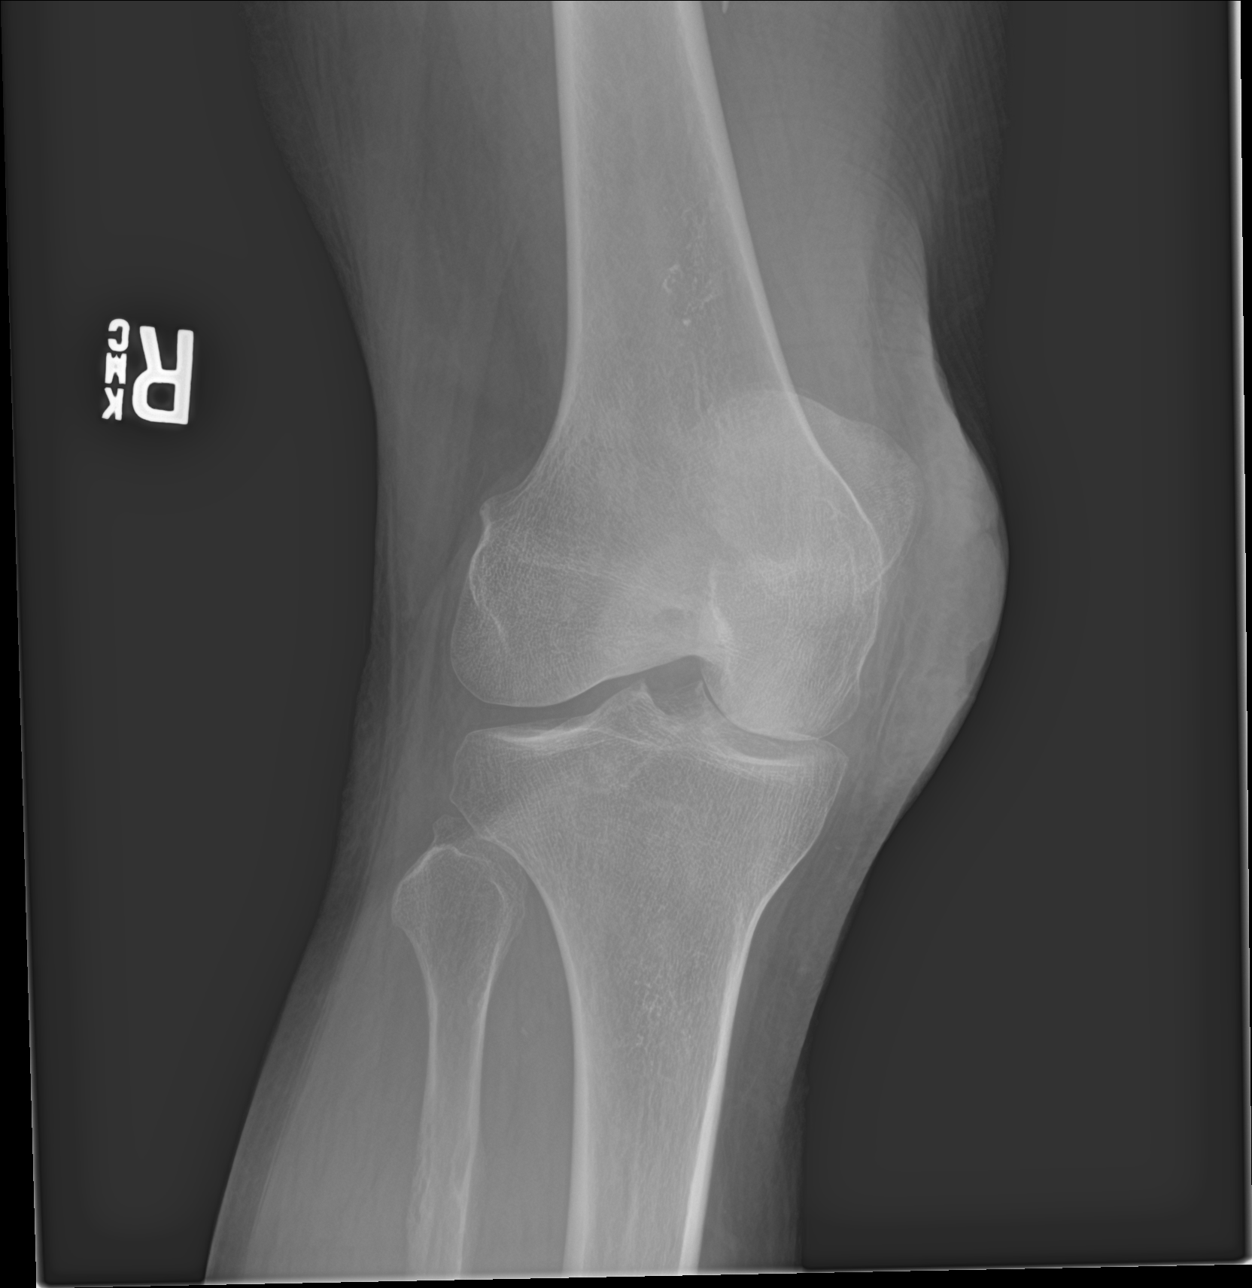

[4 of 4 positions shown; findings below may reference images not displayed]

FINDINGS: No acute fracture or dislocation is noted. Soft tissue swelling is
noted anteromedially with focal wounds identified. No other focal
abnormality is noted.
IMPRESSION: Persistent soft tissue swelling and wound without radiopaque foreign
body or underlying bony abnormality.

## 2020-12-30 ENCOUNTER — Emergency Department (HOSPITAL_COMMUNITY): Payer: Medicaid Other

## 2020-12-30 ENCOUNTER — Encounter (HOSPITAL_COMMUNITY): Payer: Self-pay | Admitting: *Deleted

## 2020-12-30 ENCOUNTER — Inpatient Hospital Stay (HOSPITAL_COMMUNITY)
Admission: EM | Admit: 2020-12-30 | Discharge: 2021-01-04 | DRG: 389 | Disposition: A | Discharge status: Home / Self Care | Payer: Medicaid Other | Attending: Internal Medicine | Admitting: Internal Medicine

## 2020-12-30 DIAGNOSIS — N390 Urinary tract infection, site not specified: Secondary | ICD-10-CM | POA: Diagnosis present

## 2020-12-30 DIAGNOSIS — E876 Hypokalemia: Secondary | ICD-10-CM | POA: Diagnosis present

## 2020-12-30 DIAGNOSIS — F909 Attention-deficit hyperactivity disorder, unspecified type: Secondary | ICD-10-CM | POA: Diagnosis present

## 2020-12-30 DIAGNOSIS — D72829 Elevated white blood cell count, unspecified: Secondary | ICD-10-CM | POA: Diagnosis present

## 2020-12-30 DIAGNOSIS — Z20822 Contact with and (suspected) exposure to covid-19: Secondary | ICD-10-CM | POA: Diagnosis present

## 2020-12-30 DIAGNOSIS — F17210 Nicotine dependence, cigarettes, uncomplicated: Secondary | ICD-10-CM | POA: Diagnosis present

## 2020-12-30 DIAGNOSIS — K56609 Unspecified intestinal obstruction, unspecified as to partial versus complete obstruction: Secondary | ICD-10-CM | POA: Diagnosis not present

## 2020-12-30 DIAGNOSIS — Z90710 Acquired absence of both cervix and uterus: Secondary | ICD-10-CM

## 2020-12-30 DIAGNOSIS — Z28310 Unvaccinated for covid-19: Secondary | ICD-10-CM

## 2020-12-30 DIAGNOSIS — K5651 Intestinal adhesions [bands], with partial obstruction: Principal | ICD-10-CM | POA: Diagnosis present

## 2020-12-30 DIAGNOSIS — Z9049 Acquired absence of other specified parts of digestive tract: Secondary | ICD-10-CM | POA: Diagnosis not present

## 2020-12-30 DIAGNOSIS — K529 Noninfective gastroenteritis and colitis, unspecified: Secondary | ICD-10-CM | POA: Diagnosis present

## 2020-12-30 DIAGNOSIS — R140 Abdominal distension (gaseous): Secondary | ICD-10-CM

## 2020-12-30 DIAGNOSIS — Z8249 Family history of ischemic heart disease and other diseases of the circulatory system: Secondary | ICD-10-CM | POA: Diagnosis not present

## 2020-12-30 DIAGNOSIS — I10 Essential (primary) hypertension: Secondary | ICD-10-CM | POA: Diagnosis present

## 2020-12-30 DIAGNOSIS — F32A Depression, unspecified: Secondary | ICD-10-CM | POA: Diagnosis present

## 2020-12-30 DIAGNOSIS — Z79899 Other long term (current) drug therapy: Secondary | ICD-10-CM | POA: Diagnosis not present

## 2020-12-30 LAB — COMPREHENSIVE METABOLIC PANEL
ALT: 18 U/L (ref 0–44)
AST: 22 U/L (ref 15–41)
Albumin: 5 g/dL (ref 3.5–5.0)
Alkaline Phosphatase: 88 U/L (ref 38–126)
Anion gap: 14 (ref 5–15)
BUN: 14 mg/dL (ref 8–23)
CO2: 26 mmol/L (ref 22–32)
Calcium: 11.5 mg/dL — ABNORMAL HIGH (ref 8.9–10.3)
Chloride: 96 mmol/L — ABNORMAL LOW (ref 98–111)
Creatinine, Ser: 0.9 mg/dL (ref 0.44–1.00)
GFR, Estimated: >60 mL/min (ref >60)
Glucose, Bld: 226 mg/dL — ABNORMAL HIGH (ref 70–99)
Potassium: 3.4 mmol/L — ABNORMAL LOW (ref 3.5–5.1)
Sodium: 136 mmol/L (ref 135–145)
Total Bilirubin: 0.8 mg/dL (ref 0.3–1.2)
Total Protein: 8.9 g/dL — ABNORMAL HIGH (ref 6.5–8.1)

## 2020-12-30 LAB — CBC WITH DIFFERENTIAL/PLATELET
Abs Immature Granulocytes: 0.11 10*3/uL — ABNORMAL HIGH (ref 0.00–0.07)
Basophils Absolute: 0.1 10*3/uL (ref 0.0–0.1)
Basophils Relative: 0 %
Eosinophils Absolute: 0.1 10*3/uL (ref 0.0–0.5)
Eosinophils Relative: 0 %
HCT: 47.1 % — ABNORMAL HIGH (ref 36.0–46.0)
Hemoglobin: 15.4 g/dL — ABNORMAL HIGH (ref 12.0–15.0)
Immature Granulocytes: 1 %
Lymphocytes Relative: 10 %
Lymphs Abs: 1.9 10*3/uL (ref 0.7–4.0)
MCH: 29.6 pg (ref 26.0–34.0)
MCHC: 32.7 g/dL (ref 30.0–36.0)
MCV: 90.4 fL (ref 80.0–100.0)
Monocytes Absolute: 0.6 10*3/uL (ref 0.1–1.0)
Monocytes Relative: 3 %
Neutro Abs: 16.9 10*3/uL — ABNORMAL HIGH (ref 1.7–7.7)
Neutrophils Relative %: 86 %
Platelets: 448 10*3/uL — ABNORMAL HIGH (ref 150–400)
RBC: 5.21 MIL/uL — ABNORMAL HIGH (ref 3.87–5.11)
RDW: 13.9 % (ref 11.5–15.5)
WBC: 19.6 10*3/uL — ABNORMAL HIGH (ref 4.0–10.5)
nRBC: 0 % (ref 0.0–0.2)

## 2020-12-30 LAB — LIPASE, BLOOD: Lipase: 25 U/L (ref 11–51)

## 2020-12-30 LAB — CBG MONITORING, ED: Glucose-Capillary: 229 mg/dL — ABNORMAL HIGH (ref 70–99)

## 2020-12-30 MED ORDER — IOHEXOL 9 MG/ML PO SOLN
500.0000 mL | ORAL | Status: AC
  Administered 2020-12-30: 23:00:00 500 mL via ORAL

## 2020-12-30 MED ORDER — MIDAZOLAM HCL 2 MG/2ML IJ SOLN
1.0000 mg | Freq: Once | INTRAMUSCULAR | Status: AC
  Administered 2020-12-30: 1 mg via INTRAVENOUS
  Filled 2020-12-30: qty 2

## 2020-12-30 MED ORDER — IOHEXOL 300 MG/ML  SOLN
100.0000 mL | Freq: Once | INTRAMUSCULAR | Status: AC | PRN
  Administered 2020-12-30: 22:00:00 100 mL via INTRAVENOUS

## 2020-12-30 NOTE — ED Triage Notes (Signed)
Abdominal pain ,constipated

## 2020-12-30 NOTE — ED Notes (Signed)
Pt returned from CT Scan 

## 2020-12-30 NOTE — ED Notes (Signed)
Patient transported to CT 

## 2020-12-30 NOTE — ED Notes (Signed)
X-ray at bedside

## 2020-12-30 NOTE — ED Provider Notes (Addendum)
[facility] EMERGENCY DEPARTMENT Provider Note   CSN: [id number] Arrival date & time: 12/30/20  1832     History Chief Complaint  Patient presents with   Abdominal Pain    Samantha Huynh is a 65 y.o. female.  Patient presents chief complaint of for 2 days now.  Describes sharp aching pain in the lower abdominal region with some swelling.  Pain is described as sharp, nonradiating.  Denies any vomiting.  Had a small bowel movement earlier today she states but feels like there is more stool she needs to pass she states.      Past Medical History:  Diagnosis Date   ADHD (attention deficit hyperactivity disorder)    Manic depression (HCC)     There are no problems to display for this patient.   Past Surgical History:  Procedure Laterality Date   ABDOMINAL HYSTERECTOMY     BREAST BIOPSY     CHOLECYSTECTOMY       OB History   No obstetric history on file.     No family history on file.  Social History   Tobacco Use   Smoking status: Every Day    Packs/day: 1.00    Types: Cigarettes   Smokeless tobacco: Never  Substance Use Topics   Alcohol use: No   Drug use: No    Home Medications Prior to Admission medications   Medication Sig Start Date End Date Taking? Authorizing Provider  ALPRAZolam Prudy Feeler) 1 MG tablet Take 1 mg by mouth at bedtime as needed for anxiety.    [provider]  amphetamine-dextroamphetamine (ADDERALL) 10 MG tablet Take 10 mg by mouth daily. 07/19/18   [provider]  cephALEXin (KEFLEX) 250 MG capsule Take 1 capsule (250 mg total) by mouth 4 (four) times daily. 07/21/18   Ivery Quale, PA-C  doxycycline (VIBRAMYCIN) 100 MG capsule Take 1 capsule (100 mg total) by mouth 2 (two) times daily. 08/20/18   Ivery Quale, PA-C  DULoxetine (CYMBALTA) 60 MG capsule Take 60 mg by mouth daily. 07/05/18   [provider]  HYDROcodone-acetaminophen (NORCO) 7.5-325 MG per tablet Take 1 tablet by mouth every 6 (six) hours as  needed for moderate pain.    [provider]  ibuprofen (ADVIL,MOTRIN) 800 MG tablet Take 1 tablet (800 mg total) by mouth 3 (three) times daily. Take with food 03/30/14   Triplett, Tammy, PA-C  lisinopril (ZESTRIL) 20 MG tablet Take 20 mg by mouth daily. 07/05/18   [provider]  oxybutynin (DITROPAN) 5 MG tablet Take 5 mg by mouth 2 (two) times daily. 06/26/18   [provider]  sertraline (ZOLOFT) 50 MG tablet Take 50 mg by mouth daily.    [provider]  traMADol (ULTRAM) 50 MG tablet Take 1 tablet (50 mg total) by mouth every 6 (six) hours as needed. 07/21/18   Ivery Quale, PA-C    Allergies    Patient has no known allergies.  Review of Systems   Review of Systems  Constitutional:  Negative for fever.  HENT:  Negative for ear pain.   Eyes:  Negative for pain.  Respiratory:  Negative for cough.   Cardiovascular:  Negative for chest pain.  Gastrointestinal:  Positive for abdominal pain.  Genitourinary:  Negative for flank pain.  Musculoskeletal:  Negative for back pain.  Skin:  Negative for rash.  Neurological:  Negative for headaches.   Physical Exam Updated Vital Signs BP (!) 159/62    Pulse 65    Temp 97.9  F (36.6 C)    Resp 14    SpO2 99%   Physical Exam Constitutional:      General: She is not in acute distress.    Appearance: Normal appearance.  HENT:     Head: Normocephalic.     Nose: Nose normal.  Eyes:     Extraocular Movements: Extraocular movements intact.  Cardiovascular:     Rate and Rhythm: Normal rate.  Pulmonary:     Effort: Pulmonary effort is normal.  Abdominal:     Tenderness: There is abdominal tenderness in the periumbilical area.  Musculoskeletal:        General: Normal range of motion.     Cervical back: Normal range of motion.  Neurological:     General: No focal deficit present.     Mental Status: She is alert. Mental status is at baseline.    ED Results / Procedures / Treatments   Labs (all labs  ordered are listed, but only abnormal results are displayed) Labs Reviewed  CBC WITH DIFFERENTIAL/PLATELET - Abnormal; Notable for the following components:      Result Value   WBC 19.6 (*)    RBC 5.21 (*)    Hemoglobin 15.4 (*)    HCT 47.1 (*)    Platelets 448 (*)    Neutro Abs 16.9 (*)    Abs Immature Granulocytes 0.11 (*)    All other components within normal limits  COMPREHENSIVE METABOLIC PANEL - Abnormal; Notable for the following components:   Potassium 3.4 (*)    Chloride 96 (*)    Glucose, Bld 226 (*)    Calcium 11.5 (*)    Total Protein 8.9 (*)    All other components within normal limits  CBG MONITORING, ED - Abnormal; Notable for the following components:   Glucose-Capillary 229 (*)    All other components within normal limits  RESP PANEL BY RT-PCR (FLU A&B, COVID) ARPGX2  LIPASE, BLOOD  URINALYSIS, ROUTINE W REFLEX MICROSCOPIC    EKG None  Radiology CT Abdomen Pelvis W Contrast  Result Date: 12/30/2020 CLINICAL DATA:  Acute abdominal pain. EXAM: CT ABDOMEN AND PELVIS WITH CONTRAST TECHNIQUE: Multidetector CT imaging of the abdomen and pelvis was performed using the standard protocol following bolus administration of intravenous contrast. CONTRAST:  OMNIPAQUE IOHEXOL 300 MG/ML  SOLN COMPARISON:  Ultrasound abdomen 06/18/2012. FINDINGS: Lower chest: No acute abnormality. Hepatobiliary: There is a rounded hypodensity in the liver which is too small to characterize, likely a small hemangioma near the gallbladder fossa. No other liver lesions are identified. The gallbladder is surgically absent. There is no biliary ductal dilatation. Pancreas: There are punctate pancreatic calcifications likely related to chronic pancreatitis. There are no acute inflammatory changes. There is no pancreatic ductal dilatation. Spleen: Normal in size without focal abnormality. Adrenals/Urinary Tract: There is no hydronephrosis or perinephric fluid. There is cortical scarring in the  superior pole the left kidney. The adrenal glands and bladder are within normal limits. Stomach/Bowel: There is a short length segment small bowel loop with wall thickening and inflammation in the pelvis image 2/71. This is transition point for small bowel obstruction seen on image 2/65. Small bowel loops proximal to this level are dilated with air-fluid levels measuring up to 4 cm in diameter. There is mild associated mesenteric edema. There is no definite pneumatosis or free air. Additionally, the stomach is dilated with air-fluid level. Colon is decompressed. There are few scattered colonic diverticula. The appendix is within normal limits. Vascular/Lymphatic: Aortic atherosclerosis.  No enlarged abdominal or pelvic lymph nodes. Reproductive: Status post hysterectomy. No adnexal masses. Other: There is a small amount of fluid adjacent to the distal esophagus, nonspecific. 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 Musculoskeletal: Degenerative changes affect the spine. IMPRESSION: 1. Small bowel wall thickening in the pelvis with surrounding inflammation worrisome for nonspecific enteritis. This is transition point for high-grade small bowel obstruction. There may be stricture at this level given CT appearance. Mesenteric edema is present which can be seen with vascular compromise. The stomach is also dilated. 2.  Other nonacute findings as above. Electronically Signed   By: Darliss Cheney M.D.   On: 12/30/2020 22:46    Procedures NG placement  Date/Time: 12/30/2020 11:42 PM Performed by: Cheryll Cockayne, MD Authorized by: Cheryll Cockayne, MD  Comments: Patient given 1 mg of Versed for NG tube placement.  I was personally at bedside assisting with placement.     Medications Ordered in ED Medications  iohexol (OMNIPAQUE) 9 MG/ML oral solution 500 mL (0 mLs Oral Hold 12/30/20 2319)  iohexol (OMNIPAQUE) 300 MG/ML solution 100 mL (100 mLs Intravenous Contrast Given 12/30/20 2217)  midazolam  (VERSED) injection 1 mg (1 mg Intravenous Given 12/30/20 2333)    ED Course  I have reviewed the triage vital signs and the nursing notes.  Pertinent labs & imaging results that were available during my care of the patient were reviewed by me and considered in my medical decision making (see chart for details).    MDM Rules/Calculators/A&P                         Patient has moderate tenderness to paramedical region.  Patient labs are elevated white count 19.  CT on pelvis pursued.  Consistent with small bowel obstruction with high-grade transition point.  NG tube ordered.  Patient mid to the hospitalist team, surgical consultation requested.    Final Clinical Impression(s) / ED Diagnoses Final diagnoses:  Small bowel obstruction [facility])    Rx / DC Orders ED Discharge Orders     None        Cheryll Cockayne, MD 12/30/20 2322    Cheryll Cockayne, MD 12/30/20 [phone]

## 2020-12-30 NOTE — ED Triage Notes (Signed)
Temperature will not register in triage

## 2020-12-31 ENCOUNTER — Other Ambulatory Visit: Payer: Self-pay

## 2020-12-31 ENCOUNTER — Encounter (HOSPITAL_COMMUNITY): Payer: Self-pay | Admitting: Family Medicine

## 2020-12-31 ENCOUNTER — Inpatient Hospital Stay (HOSPITAL_COMMUNITY): Payer: Medicaid Other

## 2020-12-31 DIAGNOSIS — D72829 Elevated white blood cell count, unspecified: Secondary | ICD-10-CM | POA: Diagnosis not present

## 2020-12-31 DIAGNOSIS — F32A Depression, unspecified: Secondary | ICD-10-CM

## 2020-12-31 DIAGNOSIS — E876 Hypokalemia: Secondary | ICD-10-CM | POA: Diagnosis not present

## 2020-12-31 DIAGNOSIS — K56609 Unspecified intestinal obstruction, unspecified as to partial versus complete obstruction: Secondary | ICD-10-CM | POA: Diagnosis not present

## 2020-12-31 DIAGNOSIS — I10 Essential (primary) hypertension: Secondary | ICD-10-CM | POA: Diagnosis present

## 2020-12-31 LAB — CBC WITH DIFFERENTIAL/PLATELET
Abs Immature Granulocytes: 0.12 10*3/uL — ABNORMAL HIGH (ref 0.00–0.07)
Basophils Absolute: 0.1 10*3/uL (ref 0.0–0.1)
Basophils Relative: 0 %
Eosinophils Absolute: 0 10*3/uL (ref 0.0–0.5)
Eosinophils Relative: 0 %
HCT: 45.2 % (ref 36.0–46.0)
Hemoglobin: 14.8 g/dL (ref 12.0–15.0)
Immature Granulocytes: 1 %
Lymphocytes Relative: 5 %
Lymphs Abs: 1.2 10*3/uL (ref 0.7–4.0)
MCH: 29.5 pg (ref 26.0–34.0)
MCHC: 32.7 g/dL (ref 30.0–36.0)
MCV: 90 fL (ref 80.0–100.0)
Monocytes Absolute: 0.8 10*3/uL (ref 0.1–1.0)
Monocytes Relative: 4 %
Neutro Abs: 20.2 10*3/uL — ABNORMAL HIGH (ref 1.7–7.7)
Neutrophils Relative %: 90 %
Platelets: 551 10*3/uL — ABNORMAL HIGH (ref 150–400)
RBC: 5.02 MIL/uL (ref 3.87–5.11)
RDW: 13.9 % (ref 11.5–15.5)
WBC: 22.4 10*3/uL — ABNORMAL HIGH (ref 4.0–10.5)
nRBC: 0 % (ref 0.0–0.2)

## 2020-12-31 LAB — COMPREHENSIVE METABOLIC PANEL
ALT: 23 U/L (ref 0–44)
AST: 28 U/L (ref 15–41)
Albumin: 4.6 g/dL (ref 3.5–5.0)
Alkaline Phosphatase: 80 U/L (ref 38–126)
Anion gap: 13 (ref 5–15)
BUN: 16 mg/dL (ref 8–23)
CO2: 26 mmol/L (ref 22–32)
Calcium: 11.1 mg/dL — ABNORMAL HIGH (ref 8.9–10.3)
Chloride: 96 mmol/L — ABNORMAL LOW (ref 98–111)
Creatinine, Ser: 0.88 mg/dL (ref 0.44–1.00)
GFR, Estimated: >60 mL/min (ref >60)
Glucose, Bld: 200 mg/dL — ABNORMAL HIGH (ref 70–99)
Potassium: 4.1 mmol/L (ref 3.5–5.1)
Sodium: 135 mmol/L (ref 135–145)
Total Bilirubin: 0.6 mg/dL (ref 0.3–1.2)
Total Protein: 8.3 g/dL — ABNORMAL HIGH (ref 6.5–8.1)

## 2020-12-31 LAB — URINALYSIS, ROUTINE W REFLEX MICROSCOPIC
Bilirubin Urine: NEGATIVE
Glucose, UA: NEGATIVE mg/dL
Ketones, ur: NEGATIVE mg/dL
Leukocytes,Ua: NEGATIVE
Nitrite: NEGATIVE
Specific Gravity, Urine: <1.005 — ABNORMAL LOW (ref 1.005–1.030)
pH: 6 (ref 5.0–8.0)

## 2020-12-31 LAB — RESP PANEL BY RT-PCR (FLU A&B, COVID) ARPGX2
Influenza A by PCR: NEGATIVE
Influenza B by PCR: NEGATIVE
SARS Coronavirus 2 by RT PCR: NEGATIVE

## 2020-12-31 LAB — URINALYSIS, MICROSCOPIC (REFLEX)

## 2020-12-31 LAB — LACTIC ACID, PLASMA
Lactic Acid, Venous: 1.8 mmol/L (ref 0.5–1.9)
Lactic Acid, Venous: 1.7 mmol/L (ref 0.5–1.9)

## 2020-12-31 LAB — MAGNESIUM: Magnesium: 2 mg/dL (ref 1.7–2.4)

## 2020-12-31 LAB — HIV ANTIBODY (ROUTINE TESTING W REFLEX): HIV Screen 4th Generation wRfx: NONREACTIVE

## 2020-12-31 MED ORDER — METOPROLOL TARTRATE 5 MG/5ML IV SOLN: 5.0000 mg | INTRAVENOUS | Status: DC | PRN

## 2020-12-31 MED ORDER — SODIUM CHLORIDE 0.9 % IV SOLN: 1.0000 g | INTRAVENOUS | Status: DC

## 2020-12-31 MED ORDER — METRONIDAZOLE 500 MG/100ML IV SOLN
500.0000 mg | Freq: Two times a day (BID) | INTRAVENOUS | Status: DC
  Administered 2020-12-31 – 2021-01-04 (×9): 500 mg via INTRAVENOUS
  Filled 2020-12-31 (×9): qty 100

## 2020-12-31 MED ORDER — SENNOSIDES-DOCUSATE SODIUM 8.6-50 MG PO TABS: 1.0000 | ORAL_TABLET | Freq: Every evening | ORAL | Status: DC | PRN

## 2020-12-31 MED ORDER — HYDRALAZINE HCL 20 MG/ML IJ SOLN: 10.0000 mg | INTRAMUSCULAR | Status: DC | PRN

## 2020-12-31 MED ORDER — ONDANSETRON HCL 4 MG/2ML IJ SOLN
4.0000 mg | Freq: Four times a day (QID) | INTRAMUSCULAR | Status: DC | PRN
  Administered 2021-01-01: 16:00:00 4 mg via INTRAVENOUS
  Filled 2020-12-31: qty 2

## 2020-12-31 MED ORDER — TRAZODONE HCL 50 MG PO TABS: 50.0000 mg | ORAL_TABLET | Freq: Every evening | ORAL | Status: DC | PRN

## 2020-12-31 MED ORDER — MORPHINE SULFATE (PF) 2 MG/ML IV SOLN
2.0000 mg | INTRAVENOUS | Status: DC | PRN
  Administered 2020-12-31 – 2021-01-01 (×4): 2 mg via INTRAVENOUS
  Filled 2020-12-31 (×4): qty 1

## 2020-12-31 MED ORDER — ACETAMINOPHEN 650 MG RE SUPP: 650.0000 mg | Freq: Four times a day (QID) | RECTAL | Status: DC | PRN

## 2020-12-31 MED ORDER — LORAZEPAM 2 MG/ML IJ SOLN
1.0000 mg | Freq: Four times a day (QID) | INTRAMUSCULAR | Status: DC | PRN
  Administered 2020-12-31: 02:00:00 1 mg via INTRAVENOUS
  Filled 2020-12-31: qty 1

## 2020-12-31 MED ORDER — HEPARIN SODIUM (PORCINE) 5000 UNIT/ML IJ SOLN
5000.0000 [IU] | Freq: Three times a day (TID) | INTRAMUSCULAR | Status: DC
  Administered 2020-12-31 – 2021-01-03 (×12): 5000 [IU] via SUBCUTANEOUS
  Filled 2020-12-31 (×11): qty 1

## 2020-12-31 MED ORDER — ONDANSETRON HCL 4 MG PO TABS: 4.0000 mg | ORAL_TABLET | Freq: Four times a day (QID) | ORAL | Status: DC | PRN

## 2020-12-31 MED ORDER — HEPARIN SODIUM (PORCINE) 5000 UNIT/ML IJ SOLN
5000.0000 [IU] | Freq: Three times a day (TID) | INTRAMUSCULAR | Status: DC
  Filled 2020-12-31: qty 1

## 2020-12-31 MED ORDER — DEXTROSE-NACL 5-0.45 % IV SOLN: INTRAVENOUS | Status: DC

## 2020-12-31 MED ORDER — POTASSIUM CHLORIDE 10 MEQ/100ML IV SOLN
10.0000 meq | INTRAVENOUS | Status: AC
  Administered 2020-12-31 (×2): 10 meq via INTRAVENOUS
  Filled 2020-12-31 (×2): qty 100

## 2020-12-31 MED ORDER — ACETAMINOPHEN 325 MG PO TABS: 650.0000 mg | ORAL_TABLET | Freq: Four times a day (QID) | ORAL | Status: DC | PRN

## 2020-12-31 MED ORDER — SODIUM CHLORIDE 0.9 % IV SOLN
2.0000 g | INTRAVENOUS | Status: DC
  Administered 2020-12-31 – 2021-01-04 (×5): 2 g via INTRAVENOUS
  Filled 2020-12-31 (×5): qty 20

## 2020-12-31 MED ORDER — IPRATROPIUM-ALBUTEROL 0.5-2.5 (3) MG/3ML IN SOLN: 3.0000 mL | RESPIRATORY_TRACT | Status: DC | PRN

## 2020-12-31 NOTE — Progress Notes (Signed)
°  Transition of Care [facility]) Screening Note   Patient Details  Name: Samantha Huynh Date of Birth: [birth year]   Transition of Care [facility]) CM/SW Contact:    Annice Needy, LCSW Phone Number: 12/31/2020, 4:33 PM    Transition of Care Department [facility]) has reviewed patient and no TOC needs have been identified at this time. We will continue to monitor patient advancement through interdisciplinary progression rounds. If new patient transition needs arise, please place a TOC consult.  Venissa Nappi, Juleen China, LCSW

## 2020-12-31 NOTE — Progress Notes (Signed)
PROGRESS NOTE    Samantha Huynh  EXN:[id number] DOB: 13-Mar-1955 DOA: 12/30/2020 PCP: Kari Baars, MD   Brief Narrative:  65 year old with history of ADHD, IBS, cholecystectomy, hysterectomy, manic depressive disorder comes to the hospital with complaints of abdominal pain that started day prior to admission.  CT in the ED was worrisome for nonspecific enteritis and high-grade bowel obstruction.  NG tube was placed, general surgery was consulted.   Assessment & Plan:   Principal Problem:   SBO (small bowel obstruction) (HCC) Active Problems:   Depression   Hypokalemia   Accelerated hypertension   Leukocytosis  High-grade small bowel obstruction Nonspecific enteritis - Currently NG tube in place, patient is NPO.  General surgery is to see the patient.  Suspect adhesions from previous abdominal surgeries.  Continue to monitor.  Antiemetics, supportive care.  Add D5 half-normal saline while n.p.o.  Hypokalemia - Repletion as needed  Leukocytosis Nonspecific enteritis - Suspect reactive but also has nonspecific enteritis.  If patient has diarrhea will order stool studies.  Start empiric antibiotics- Roc/Flagyl.  Enteritis could be infected or inflammatory  Urinary tract infection - Send urine cultures.  Start empiric antibiotics  Essential hypertension - IV as needed medications.  P.o. on hold  Depression - Home meds on hold  Tobacco use - Nicotine patch as needed with patient is not interested     DVT prophylaxis: Subcu heparin Code Status: Full code Family Communication:    Status is: Inpatient  Remains inpatient appropriate because: NGT in place for SBO. Maintain hosp stay       Nutritional status           There is no height or weight on file to calculate BMI.           Subjective: Feels a little better since NGT is in place. No BM yet. She has not passing any gas either.   Review of Systems Otherwise negative except as per HPI,  including: General: Denies fever, chills, night sweats or unintended weight loss. Resp: Denies cough, wheezing, shortness of breath. Cardiac: Denies chest pain, palpitations, orthopnea, paroxysmal nocturnal dyspnea. GI: Denies  diarrhea or constipation GU: Denies dysuria, frequency, hesitancy or incontinence MS: Denies muscle aches, joint pain or swelling Neuro: Denies headache, neurologic deficits (focal weakness, numbness, tingling), abnormal gait Psych: Denies anxiety, depression, SI/HI/AVH Skin: Denies new rashes or lesions ID: Denies sick contacts, exotic exposures, travel  Examination:  General exam: Appears calm and comfortable  Respiratory system: Clear to auscultation. Respiratory effort normal. Cardiovascular system: S1 & S2 heard, RRR. No JVD, murmurs, rubs, gallops or clicks. No pedal edema. Gastrointestinal system: Abdomen is nondistended, soft and nontender. No bowel sounds.  Central nervous system: Alert and oriented. No focal neurological deficits. Extremities: Symmetric 5 x 5 power. Skin: No rashes, lesions or ulcers Psychiatry: Judgement and insight appear normal. Mood & affect appropriate.   NGT in place.   Objective: Vitals:   12/31/20 0400 12/31/20 0530 12/31/20 0600 12/31/20 0800  BP: (!) 166/65 137/60 (!) 146/68 (!) 151/70  Pulse: 84 81 84 88  Resp: 17 18 17 16   Temp:      TempSrc:      SpO2: 97% 100% 95% 92%    Intake/Output Summary (Last 24 hours) at 12/31/2020 1002 Last data filed at 12/31/2020 0232 Gross per 24 hour  Intake 100 ml  Output 800 ml  Net -700 ml   There were no vitals filed for this visit.   Data Reviewed:   CBC:  Recent Labs  Lab 12/30/20 1952 12/31/20 0018  WBC 19.6* 22.4*  NEUTROABS 16.9* 20.2*  HGB 15.4* 14.8  HCT 47.1* 45.2  MCV 90.4 90.0  PLT 448* 551*   Basic Metabolic Panel: Recent Labs  Lab 12/30/20 1952 12/31/20 0018  NA 136 135  K 3.4* 4.1  CL 96* 96*  CO2 26 26  GLUCOSE 226* 200*  BUN 14 16   CREATININE 0.90 0.88  CALCIUM 11.5* 11.1*  MG  --  2.0   GFR: CrCl cannot be calculated (Unknown ideal weight.). Liver Function Tests: Recent Labs  Lab 12/30/20 1952 12/31/20 0018  AST 22 28  ALT 18 23  ALKPHOS 88 80  BILITOT 0.8 0.6  PROT 8.9* 8.3*  ALBUMIN 5.0 4.6   Recent Labs  Lab 12/30/20 1952  LIPASE 25   No results for input(s): AMMONIA in the last 168 hours. Coagulation Profile: No results for input(s): INR, PROTIME in the last 168 hours. Cardiac Enzymes: No results for input(s): CKTOTAL, CKMB, CKMBINDEX, TROPONINI in the last 168 hours. BNP (last 3 results) No results for input(s): PROBNP in the last 8760 hours. HbA1C: No results for input(s): HGBA1C in the last 72 hours. CBG: Recent Labs  Lab 12/30/20 1853  GLUCAP 229*   Lipid Profile: No results for input(s): CHOL, HDL, LDLCALC, TRIG, CHOLHDL, LDLDIRECT in the last 72 hours. Thyroid Function Tests: No results for input(s): TSH, T4TOTAL, FREET4, T3FREE, THYROIDAB in the last 72 hours. Anemia Panel: No results for input(s): VITAMINB12, FOLATE, FERRITIN, TIBC, IRON, RETICCTPCT in the last 72 hours. Sepsis Labs: Recent Labs  Lab 12/31/20 0017 12/31/20 0307  LATICACIDVEN 1.8 1.7    Recent Results (from the past 240 hour(s))  Resp Panel by RT-PCR (Flu A&B, Covid) Nasopharyngeal Swab     Status: None   Collection Time: 12/30/20 11:30 PM   Specimen: Nasopharyngeal Swab; Nasopharyngeal(NP) swabs in vial transport medium  Result Value Ref Range Status   SARS Coronavirus 2 by RT PCR NEGATIVE NEGATIVE Final    Comment: (NOTE) SARS-CoV-2 target nucleic acids are NOT DETECTED.  The SARS-CoV-2 RNA is generally detectable in upper respiratory specimens during the acute phase of infection. The lowest concentration of SARS-CoV-2 viral copies this assay can detect is 138 copies/mL. A negative result does not preclude SARS-Cov-2 infection and should not be used as the sole basis for treatment or other  patient management decisions. A negative result may occur with  improper specimen collection/handling, submission of specimen other than nasopharyngeal swab, presence of viral mutation(s) within the areas targeted by this assay, and inadequate number of viral copies(<138 copies/mL). A negative result must be combined with clinical observations, patient history, and epidemiological information. The expected result is Negative.  Fact Sheet for Patients:  BloggerCourse.com  Fact Sheet for Healthcare Providers:  SeriousBroker.it  This test is no t yet approved or cleared by the [place] FDA and  has been authorized for detection and/or diagnosis of SARS-CoV-2 by FDA under an Emergency Use Authorization (EUA). This EUA will remain  in effect (meaning this test can be used) for the duration of the COVID-19 declaration under Section 564(b)(1) of the Act, 21 U.S.C.section 360bbb-3(b)(1), unless the authorization is terminated  or revoked sooner.       Influenza A by PCR NEGATIVE NEGATIVE Final   Influenza B by PCR NEGATIVE NEGATIVE Final    Comment: (NOTE) The Xpert Xpress SARS-CoV-2/FLU/RSV plus assay is intended as an aid in the diagnosis of influenza from Nasopharyngeal swab specimens and  should not be used as a sole basis for treatment. Nasal washings and aspirates are unacceptable for Xpert Xpress SARS-CoV-2/FLU/RSV testing.  Fact Sheet for Patients: BloggerCourse.com  Fact Sheet for Healthcare Providers: SeriousBroker.it  This test is not yet approved or cleared by the [place] FDA and has been authorized for detection and/or diagnosis of SARS-CoV-2 by FDA under an Emergency Use Authorization (EUA). This EUA will remain in effect (meaning this test can be used) for the duration of the COVID-19 declaration under Section 564(b)(1) of the Act, 21 U.S.C. section  360bbb-3(b)(1), unless the authorization is terminated or revoked.  Performed at [facility], [address], [place], [place] [address]          Radiology Studies: Abd 1 View (KUB)  Result Date: 12/31/2020 CLINICAL DATA:  Lower abdominal pain.  SBO. EXAM: ABDOMEN - 1 VIEW COMPARISON:  KUB and CT AP, 12/30/2020. FINDINGS: Support lines: NG tube, incompletely imaged. Multiple air-and-fluid distended loops of small bowel within the central abdomen. Nondilated colon. No radiographic findings of intraperitoneal gas. Contrast layering within the urinary bladder. Cholecystectomy clips. Degenerative changes of spine. No interval osseous abnormality. IMPRESSION: Persistent findings of small-bowel obstruction. Electronically Signed   By: Roanna Banning M.D.   On: 12/31/2020 07:54   DG Abdomen 1 View  Result Date: 12/31/2020 CLINICAL DATA:  Check gastric catheter placement EXAM: ABDOMEN - 1 VIEW COMPARISON:  None. FINDINGS: Gastric catheter is noted within the stomach. Cardiac shadow is within normal limits. Contrast is noted within the collecting systems from recent CT. IMPRESSION: Gastric catheter within the stomach. Electronically Signed   By: Alcide Clever M.D.   On: 12/31/2020 00:07   CT Abdomen Pelvis W Contrast  Result Date: 12/30/2020 CLINICAL DATA:  Acute abdominal pain. EXAM: CT ABDOMEN AND PELVIS WITH CONTRAST TECHNIQUE: Multidetector CT imaging of the abdomen and pelvis was performed using the standard protocol following bolus administration of intravenous contrast. CONTRAST:  OMNIPAQUE IOHEXOL 300 MG/ML  SOLN COMPARISON:  Ultrasound abdomen 06/18/2012. FINDINGS: Lower chest: No acute abnormality. Hepatobiliary: There is a rounded hypodensity in the liver which is too small to characterize, likely a small hemangioma near the gallbladder fossa. No other liver lesions are identified. The gallbladder is surgically absent. There is no biliary ductal dilatation. Pancreas: There are punctate  pancreatic calcifications likely related to chronic pancreatitis. There are no acute inflammatory changes. There is no pancreatic ductal dilatation. Spleen: Normal in size without focal abnormality. Adrenals/Urinary Tract: There is no hydronephrosis or perinephric fluid. There is cortical scarring in the superior pole the left kidney. The adrenal glands and bladder are within normal limits. Stomach/Bowel: There is a short length segment small bowel loop with wall thickening and inflammation in the pelvis image 2/71. This is transition point for small bowel obstruction seen on image 2/65. Small bowel loops proximal to this level are dilated with air-fluid levels measuring up to 4 cm in diameter. There is mild associated mesenteric edema. There is no definite pneumatosis or free air. Additionally, the stomach is dilated with air-fluid level. Colon is decompressed. There are few scattered colonic diverticula. The appendix is within normal limits. Vascular/Lymphatic: Aortic atherosclerosis. No enlarged abdominal or pelvic lymph nodes. Reproductive: Status post hysterectomy. No adnexal masses. Other: There is a small amount of fluid adjacent to the distal esophagus, nonspecific. 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 Musculoskeletal: Degenerative changes affect the spine. IMPRESSION: 1. Small bowel wall thickening in the pelvis with surrounding inflammation worrisome for nonspecific  enteritis. This is transition point for high-grade small bowel obstruction. There may be stricture at this level given CT appearance. Mesenteric edema is present which can be seen with vascular compromise. The stomach is also dilated. 2.  Other nonacute findings as above. Electronically Signed   By: Darliss Cheney M.D.   On: 12/30/2020 22:46        Scheduled Meds:  heparin  5,000 Units Subcutaneous Q8H   Continuous Infusions:   LOS: 1 day   Time spent= 35 mins    Jerri Hargadon Joline Maxcy, MD [facility]  If 7PM-7AM, please contact night-coverage  12/31/2020, 10:02 AM

## 2020-12-31 NOTE — Consult Note (Signed)
Reason for Consult: Small bowel obstruction Referring Physician: Dr. Frutoso Schatz is an 65 y.o. female.  HPI: Patient is a 65 year old white female who presented with a less than 24-hour history of worsening abdominal pain and distention.  She presented to the emergency room and a CT scan of the abdomen revealed small bowel enteritis with evidence of a possible small bowel obstruction.  An NG tube was placed and she was admitted to the hospital for further evaluation and treatment.  She was noted to have leukocytosis.  Today, she states her abdominal pain has resolved and her abdominal distention has resolved.  She has not passed gas or had a bowel movement yet.  She has no resting pain.  She denies any fever or chills.  Past Medical History:  Diagnosis Date   ADHD (attention deficit hyperactivity disorder)    Manic depression (HCC)     Past Surgical History:  Procedure Laterality Date   ABDOMINAL HYSTERECTOMY     BREAST BIOPSY     CHOLECYSTECTOMY      No family history on file.  Social History:  reports that she has been smoking cigarettes. She has been smoking an average of 1 pack per day. She has never used smokeless tobacco. She reports that she does not drink alcohol and does not use drugs.  Allergies: No Known Allergies  Medications: I have reviewed the patient's current medications. Prior to Admission:  Medications Prior to Admission  Medication Sig Dispense Refill Last Dose   dicyclomine (BENTYL) 20 MG tablet Take 20 mg by mouth 4 (four) times daily.   12/30/2020   escitalopram (LEXAPRO) 20 MG tablet Take 20 mg by mouth daily.   Past Week   lisinopril (ZESTRIL) 20 MG tablet Take 20 mg by mouth daily.   Past Week   cephALEXin (KEFLEX) 250 MG capsule Take 1 capsule (250 mg total) by mouth 4 (four) times daily. (Patient not taking: Reported on 12/31/2020) 28 capsule 0 Completed Course   doxycycline (VIBRAMYCIN) 100 MG capsule Take 1 capsule (100 mg total) by mouth 2 (two)  times daily. (Patient not taking: Reported on 12/31/2020) 14 capsule 0 Completed Course   ibuprofen (ADVIL,MOTRIN) 800 MG tablet Take 1 tablet (800 mg total) by mouth 3 (three) times daily. Take with food (Patient not taking: Reported on 12/31/2020) 21 tablet 0 Not Taking   traMADol (ULTRAM) 50 MG tablet Take 1 tablet (50 mg total) by mouth every 6 (six) hours as needed. (Patient not taking: Reported on 12/31/2020) 10 tablet 0 Completed Course    Results for orders placed or performed during the hospital encounter of 12/30/20 (from the past 48 hour(s))  Urinalysis, Routine w reflex microscopic Urine, Clean Catch     Status: Abnormal   Collection Time: 12/30/20  5:17 AM  Result Value Ref Range   Color, Urine YELLOW YELLOW   APPearance CLEAR CLEAR   Specific Gravity, Urine <1.005 (L) 1.005 - 1.030   pH 6.0 5.0 - 8.0   Glucose, UA NEGATIVE NEGATIVE mg/dL   Hgb urine dipstick MODERATE (A) NEGATIVE   Bilirubin Urine NEGATIVE NEGATIVE   Ketones, ur NEGATIVE NEGATIVE mg/dL   Protein, ur TRACE (A) NEGATIVE mg/dL   Nitrite NEGATIVE NEGATIVE   Leukocytes,Ua NEGATIVE NEGATIVE    Comment: Performed at [facility], [address], [place], [place] [address]  Urinalysis, Microscopic (reflex)     Status: Abnormal   Collection Time: 12/30/20  5:17 AM  Result Value Ref Range   RBC /  HPF 6-10 0 - 5 RBC/hpf   WBC, UA 11-20 0 - 5 WBC/hpf   Bacteria, UA MANY (A) NONE SEEN   Squamous Epithelial / LPF 11-20 0 - 5   Hyaline Casts, UA PRESENT    Granular Casts, UA PRESENT     Comment: Performed at [facility], [address], [place], [place] [address]  CBG monitoring, ED     Status: Abnormal   Collection Time: 12/30/20  6:53 PM  Result Value Ref Range   Glucose-Capillary 229 (H) 70 - 99 mg/dL    Comment: Glucose reference range applies only to samples taken after fasting for at least 8 hours.  CBC with Differential     Status: Abnormal   Collection Time: 12/30/20  7:52 PM  Result Value Ref Range   WBC  19.6 (H) 4.0 - 10.5 K/uL   RBC 5.21 (H) 3.87 - 5.11 MIL/uL   Hemoglobin 15.4 (H) 12.0 - 15.0 g/dL   HCT 46.9 (H) 62.9 - 52.8 %   MCV 90.4 80.0 - 100.0 fL   MCH 29.6 26.0 - 34.0 pg   MCHC 32.7 30.0 - 36.0 g/dL   RDW 41.3 24.4 - 01.0 %   Platelets 448 (H) 150 - 400 K/uL   nRBC 0.0 0.0 - 0.2 %   Neutrophils Relative % 86 %   Neutro Abs 16.9 (H) 1.7 - 7.7 K/uL   Lymphocytes Relative 10 %   Lymphs Abs 1.9 0.7 - 4.0 K/uL   Monocytes Relative 3 %   Monocytes Absolute 0.6 0.1 - 1.0 K/uL   Eosinophils Relative 0 %   Eosinophils Absolute 0.1 0.0 - 0.5 K/uL   Basophils Relative 0 %   Basophils Absolute 0.1 0.0 - 0.1 K/uL   Immature Granulocytes 1 %   Abs Immature Granulocytes 0.11 (H) 0.00 - 0.07 K/uL    Comment: Performed at [facility], [address] [place], [place] [address]  Comprehensive metabolic panel     Status: Abnormal   Collection Time: 12/30/20  7:52 PM  Result Value Ref Range   Sodium 136 135 - 145 mmol/L   Potassium 3.4 (L) 3.5 - 5.1 mmol/L   Chloride 96 (L) 98 - 111 mmol/L   CO2 26 22 - 32 mmol/L   Glucose, Bld 226 (H) 70 - 99 mg/dL    Comment: Glucose reference range applies only to samples taken after fasting for at least 8 hours.   BUN 14 8 - 23 mg/dL   Creatinine, Ser 6.64 0.44 - 1.00 mg/dL   Calcium 40.3 (H) 8.9 - 10.3 mg/dL   Total Protein 8.9 (H) 6.5 - 8.1 g/dL   Albumin 5.0 3.5 - 5.0 g/dL   AST 22 15 - 41 U/L   ALT 18 0 - 44 U/L   Alkaline Phosphatase 88 38 - 126 U/L   Total Bilirubin 0.8 0.3 - 1.2 mg/dL   GFR, Estimated >47 >42 mL/min    Comment: (NOTE) Calculated using the CKD-EPI Creatinine Equation (2021)    Anion gap 14 5 - 15    Comment: Performed at [facility], [address] [place], [place] [address]  Lipase, blood     Status: None   Collection Time: 12/30/20  7:52 PM  Result Value Ref Range   Lipase 25 11 - 51 U/L    Comment: Performed at [facility], [address] [place], [place] [address]  Resp Panel by RT-PCR (Flu A&B, Covid)  Nasopharyngeal Swab     Status: None  Collection Time: 12/30/20 11:30 PM   Specimen: Nasopharyngeal Swab; Nasopharyngeal(NP) swabs in vial transport medium  Result Value Ref Range   SARS Coronavirus 2 by RT PCR NEGATIVE NEGATIVE    Comment: (NOTE) SARS-CoV-2 target nucleic acids are NOT DETECTED.  The SARS-CoV-2 RNA is generally detectable in upper respiratory specimens during the acute phase of infection. The lowest concentration of SARS-CoV-2 viral copies this assay can detect is 138 copies/mL. A negative result does not preclude SARS-Cov-2 infection and should not be used as the sole basis for treatment or other patient management decisions. A negative result may occur with  improper specimen collection/handling, submission of specimen other than nasopharyngeal swab, presence of viral mutation(s) within the areas targeted by this assay, and inadequate number of viral copies(<138 copies/mL). A negative result must be combined with clinical observations, patient history, and epidemiological information. The expected result is Negative.  Fact Sheet for Patients:  BloggerCourse.com  Fact Sheet for Healthcare Providers:  SeriousBroker.it  This test is no t yet approved or cleared by the [place] FDA and  has been authorized for detection and/or diagnosis of SARS-CoV-2 by FDA under an Emergency Use Authorization (EUA). This EUA will remain  in effect (meaning this test can be used) for the duration of the COVID-19 declaration under Section 564(b)(1) of the Act, 21 U.S.C.section 360bbb-3(b)(1), unless the authorization is terminated  or revoked sooner.       Influenza A by PCR NEGATIVE NEGATIVE   Influenza B by PCR NEGATIVE NEGATIVE    Comment: (NOTE) The Xpert Xpress SARS-CoV-2/FLU/RSV plus assay is intended as an aid in the diagnosis of influenza from Nasopharyngeal swab specimens and should not be used as a sole basis for  treatment. Nasal washings and aspirates are unacceptable for Xpert Xpress SARS-CoV-2/FLU/RSV testing.  Fact Sheet for Patients: BloggerCourse.com  Fact Sheet for Healthcare Providers: SeriousBroker.it  This test is not yet approved or cleared by the [place] FDA and has been authorized for detection and/or diagnosis of SARS-CoV-2 by FDA under an Emergency Use Authorization (EUA). This EUA will remain in effect (meaning this test can be used) for the duration of the COVID-19 declaration under Section 564(b)(1) of the Act, 21 U.S.C. section 360bbb-3(b)(1), unless the authorization is terminated or revoked.  Performed at [facility], [address], [place], [place] [address]   Lactic acid, plasma     Status: None   Collection Time: 12/31/20 12:17 AM  Result Value Ref Range   Lactic Acid, Venous 1.8 0.5 - 1.9 mmol/L    Comment: Performed at [facility], [address] [place], [place] [address]  Comprehensive metabolic panel     Status: Abnormal   Collection Time: 12/31/20 12:18 AM  Result Value Ref Range   Sodium 135 135 - 145 mmol/L   Potassium 4.1 3.5 - 5.1 mmol/L    Comment: DELTA CHECK NOTED   Chloride 96 (L) 98 - 111 mmol/L   CO2 26 22 - 32 mmol/L   Glucose, Bld 200 (H) 70 - 99 mg/dL    Comment: Glucose reference range applies only to samples taken after fasting for at least 8 hours.   BUN 16 8 - 23 mg/dL   Creatinine, Ser 0.98 0.44 - 1.00 mg/dL   Calcium 11.9 (H) 8.9 - 10.3 mg/dL   Total Protein 8.3 (H) 6.5 - 8.1 g/dL   Albumin 4.6 3.5 - 5.0 g/dL   AST 28 15 - 41 U/L   ALT 23 0 - 44 U/L  Alkaline Phosphatase 80 38 - 126 U/L   Total Bilirubin 0.6 0.3 - 1.2 mg/dL   GFR, Estimated >16 >10 mL/min    Comment: (NOTE) Calculated using the CKD-EPI Creatinine Equation (2021)    Anion gap 13 5 - 15    Comment: Performed at [facility], [address] [place], [place] [address]  Magnesium     Status: None    Collection Time: 12/31/20 12:18 AM  Result Value Ref Range   Magnesium 2.0 1.7 - 2.4 mg/dL    Comment: Performed at [facility], [address], [place], [place] [address]  CBC WITH DIFFERENTIAL     Status: Abnormal   Collection Time: 12/31/20 12:18 AM  Result Value Ref Range   WBC 22.4 (H) 4.0 - 10.5 K/uL   RBC 5.02 3.87 - 5.11 MIL/uL   Hemoglobin 14.8 12.0 - 15.0 g/dL   HCT 19.1 47.8 - 29.5 %   MCV 90.0 80.0 - 100.0 fL   MCH 29.5 26.0 - 34.0 pg   MCHC 32.7 30.0 - 36.0 g/dL   RDW 62.1 30.8 - 65.7 %   Platelets 551 (H) 150 - 400 K/uL   nRBC 0.0 0.0 - 0.2 %   Neutrophils Relative % 90 %   Neutro Abs 20.2 (H) 1.7 - 7.7 K/uL   Lymphocytes Relative 5 %   Lymphs Abs 1.2 0.7 - 4.0 K/uL   Monocytes Relative 4 %   Monocytes Absolute 0.8 0.1 - 1.0 K/uL   Eosinophils Relative 0 %   Eosinophils Absolute 0.0 0.0 - 0.5 K/uL   Basophils Relative 0 %   Basophils Absolute 0.1 0.0 - 0.1 K/uL   Immature Granulocytes 1 %   Abs Immature Granulocytes 0.12 (H) 0.00 - 0.07 K/uL    Comment: Performed at [facility], [address], [place], [place] [address]  Lactic acid, plasma     Status: None   Collection Time: 12/31/20  3:07 AM  Result Value Ref Range   Lactic Acid, Venous 1.7 0.5 - 1.9 mmol/L    Comment: Performed at [facility], [address], [place], [place] [address]  HIV Antibody (routine testing w rflx)     Status: None   Collection Time: 12/31/20  3:07 AM  Result Value Ref Range   HIV Screen 4th Generation wRfx Non Reactive Non Reactive    Comment: Performed at [facility], [address] [place], [place] [address]  Culture, blood (Routine X 2) w Reflex to ID Panel     Status: None (Preliminary result)   Collection Time: 12/31/20 10:23 AM   Specimen: Left Antecubital; Blood  Result Value Ref Range   Specimen Description LEFT ANTECUBITAL    Special Requests      BOTTLES DRAWN AEROBIC AND ANAEROBIC Blood Culture adequate volume Performed at [facility], [address],  [place], [place] [address]    Culture PENDING    Report Status PENDING     Abd 1 View (KUB)  Result Date: 12/31/2020 CLINICAL DATA:  Lower abdominal pain.  SBO. EXAM: ABDOMEN - 1 VIEW COMPARISON:  KUB and CT AP, 12/30/2020. FINDINGS: Support lines: NG tube, incompletely imaged. Multiple air-and-fluid distended loops of small bowel within the central abdomen. Nondilated colon. No radiographic findings of intraperitoneal gas. Contrast layering within the urinary bladder. Cholecystectomy clips. Degenerative changes of spine. No interval osseous abnormality. IMPRESSION: Persistent findings of small-bowel obstruction. Electronically Signed   By: Roanna Banning M.D.   On: 12/31/2020 07:54   DG  Abdomen 1 View  Result Date: 12/31/2020 CLINICAL DATA:  Check gastric catheter placement EXAM: ABDOMEN - 1 VIEW COMPARISON:  None. FINDINGS: Gastric catheter is noted within the stomach. Cardiac shadow is within normal limits. Contrast is noted within the collecting systems from recent CT. IMPRESSION: Gastric catheter within the stomach. Electronically Signed   By: Alcide Clever M.D.   On: 12/31/2020 00:07   CT Abdomen Pelvis W Contrast  Result Date: 12/30/2020 CLINICAL DATA:  Acute abdominal pain. EXAM: CT ABDOMEN AND PELVIS WITH CONTRAST TECHNIQUE: Multidetector CT imaging of the abdomen and pelvis was performed using the standard protocol following bolus administration of intravenous contrast. CONTRAST:  OMNIPAQUE IOHEXOL 300 MG/ML  SOLN COMPARISON:  Ultrasound abdomen 06/18/2012. FINDINGS: Lower chest: No acute abnormality. Hepatobiliary: There is a rounded hypodensity in the liver which is too small to characterize, likely a small hemangioma near the gallbladder fossa. No other liver lesions are identified. The gallbladder is surgically absent. There is no biliary ductal dilatation. Pancreas: There are punctate pancreatic calcifications likely related to chronic pancreatitis. There are no acute inflammatory  changes. There is no pancreatic ductal dilatation. Spleen: Normal in size without focal abnormality. Adrenals/Urinary Tract: There is no hydronephrosis or perinephric fluid. There is cortical scarring in the superior pole the left kidney. The adrenal glands and bladder are within normal limits. Stomach/Bowel: There is a short length segment small bowel loop with wall thickening and inflammation in the pelvis image 2/71. This is transition point for small bowel obstruction seen on image 2/65. Small bowel loops proximal to this level are dilated with air-fluid levels measuring up to 4 cm in diameter. There is mild associated mesenteric edema. There is no definite pneumatosis or free air. Additionally, the stomach is dilated with air-fluid level. Colon is decompressed. There are few scattered colonic diverticula. The appendix is within normal limits. Vascular/Lymphatic: Aortic atherosclerosis. No enlarged abdominal or pelvic lymph nodes. Reproductive: Status post hysterectomy. No adnexal masses. Other: There is a small amount of fluid adjacent to the distal esophagus, nonspecific. 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 Musculoskeletal: Degenerative changes affect the spine. IMPRESSION: 1. Small bowel wall thickening in the pelvis with surrounding inflammation worrisome for nonspecific enteritis. This is transition point for high-grade small bowel obstruction. There may be stricture at this level given CT appearance. Mesenteric edema is present which can be seen with vascular compromise. The stomach is also dilated. 2.  Other nonacute findings as above. Electronically Signed   By: Darliss Cheney M.D.   On: 12/30/2020 22:46    ROS:  Pertinent items are noted in HPI.  Blood pressure 125/63, pulse 86, temperature 98.9 F (37.2 C), temperature source Oral, resp. rate 18, SpO2 100 %. Physical Exam: Well-developed well-nourished white female no acute distress Head is normocephalic, atraumatic Lungs  clear to auscultation with good breath sounds bilaterally Heart examination reveals regular rate and rhythm without S3, S4, murmurs Abdomen is soft and not particularly distended.  Bowel sounds are present.  No rigidity is noted.  She has no pain to deep palpation.  CT scan images personally reviewed  Assessment/Plan: Impression: Enteritis with an element of bowel obstruction.  Adhesive disease could also be playing a factor, though at this point I would treat her for the enteritis.  No need for acute surgical invention at this time.  Would continue NG tube decompression.  We will continue IV hydration.  Agree with antibiotic therapy.  We will follow with you.  Franky Macho  12/31/2020, 1:12 PM

## 2020-12-31 NOTE — H&P (Signed)
TRH H&P    Patient Demographics:    Samantha Huynh, is a 65 y.o. female  MRN: [id number]  DOB - 1955-12-21  Admit Date - 12/30/2020  Referring MD/NP/PA: Audley Hose  Outpatient Primary MD for the patient is Kari Baars, MD  Patient coming from: Home  Chief complaint- Abdominal pain   HPI:    Samantha Huynh  is a 65 y.o. female, with a of ADHD and manic depressive disorder presents ED with a chief complaint of abdominal pain.  Patient reports that the pain was sudden in onset and woke her up out of sleep at 4 AM on the 22nd.  The pain was at first lower abdominal and then moved up to her periumbilical region.  It was cramping, and felt worse than labor pains.  Bending over helps, laying flat made it worse.  She drinks some juice but did not eat all day.  Juice did not help with pain or make it worse.  Last normal meal was the 21st.  Last normal bowel movement was the 28th.  Patient has a history of IBS and does not think much of it when her bowel habits are not regular.  Patient does have a history of surgeries in her abdomen including cholecystectomy and hysterectomy.  Patient reports that she was not nauseous and did not have any vomiting at home.  She has not had any diarrhea.  Was the pain that brought her into the ED, with the pain being constant.  Patient reports that the NG tube is helped and she has been in the ED.  She has no other complaints at this time.  Patient is a current smoker a pack a day and declines a nicotine patch at this time.  She no longer drinks alcohol.  She does not use illicit drugs.  She is not vaccinated for COVID.  Patient is full code.  , ED Temp 97.9, heart rate 63, respiratory rate 14, blood pressure 159/62, satting at 99% Leukocytosis 19.6, hemoglobin 15.4 Chemistry panel reveals a glucose of 226, with no known history of diabetes mellitus Respiratory panel negative CT abdomen pelvis  shows small bowel wall thickening in the pelvis with surrounding inflammation worrisome for nonspecific enteritis.  There is a transition point for high-grade bowel obstruction.  Possibly due to stricture?  Stomach dilation and bowel edema also seen NG tube placed UA pending ED provider spoke with Dr. Lovell Sheehan who plans to see the patient in a.m. Admission requested for small bowel obstruction    Review of systems:    In addition to the HPI above,  No Fever-chills, No Headache, No changes with Vision or hearing, No problems swallowing food or Liquids, No Chest pain, Cough or Shortness of Breath, No Blood in stool or Urine, No dysuria, No new skin rashes or bruises, No new joints pains-aches,  No new weakness, tingling, numbness in any extremity, No recent weight gain or loss, No polyuria, polydypsia or polyphagia, No significant Mental Stressors.  All other systems reviewed and are negative.    Past History of the  following :    Past Medical History:  Diagnosis Date   ADHD (attention deficit hyperactivity disorder)    Manic depression (HCC)       Past Surgical History:  Procedure Laterality Date   ABDOMINAL HYSTERECTOMY     BREAST BIOPSY     CHOLECYSTECTOMY        Social History:      Social History   Tobacco Use   Smoking status: Every Day    Packs/day: 1.00    Types: Cigarettes   Smokeless tobacco: Never  Substance Use Topics   Alcohol use: No       Family History :    No family history on file. Family history hypertension   Home Medications:   Prior to Admission medications   Medication Sig Start Date End Date Taking? Authorizing Provider  ALPRAZolam Prudy Feeler) 1 MG tablet Take 1 mg by mouth at bedtime as needed for anxiety.    [provider]  amphetamine-dextroamphetamine (ADDERALL) 10 MG tablet Take 10 mg by mouth daily. 07/19/18   [provider]  cephALEXin (KEFLEX) 250 MG capsule Take 1 capsule (250 mg total) by mouth 4  (four) times daily. 07/21/18   Ivery Quale, PA-C  doxycycline (VIBRAMYCIN) 100 MG capsule Take 1 capsule (100 mg total) by mouth 2 (two) times daily. 08/20/18   Ivery Quale, PA-C  DULoxetine (CYMBALTA) 60 MG capsule Take 60 mg by mouth daily. 07/05/18   [provider]  HYDROcodone-acetaminophen (NORCO) 7.5-325 MG per tablet Take 1 tablet by mouth every 6 (six) hours as needed for moderate pain.    [provider]  ibuprofen (ADVIL,MOTRIN) 800 MG tablet Take 1 tablet (800 mg total) by mouth 3 (three) times daily. Take with food 03/30/14   Triplett, Tammy, PA-C  lisinopril (ZESTRIL) 20 MG tablet Take 20 mg by mouth daily. 07/05/18   [provider]  oxybutynin (DITROPAN) 5 MG tablet Take 5 mg by mouth 2 (two) times daily. 06/26/18   [provider]  sertraline (ZOLOFT) 50 MG tablet Take 50 mg by mouth daily.    [provider]  traMADol (ULTRAM) 50 MG tablet Take 1 tablet (50 mg total) by mouth every 6 (six) hours as needed. 07/21/18   Ivery Quale, PA-C     Allergies:    No Known Allergies   Physical Exam:   Vitals  Blood pressure (!) 167/65, pulse 79, temperature 97.9 F (36.6 C), resp. rate 16, SpO2 97 %.   1.  General: Patient lying supine in bed,  no acute distress   2. Psychiatric: Alert and oriented x 3, mood and behavior normal for situation, pleasant and cooperative with exam   3. Neurologic: Speech and language are normal, face is symmetric, moves all 4 extremities voluntarily, at baseline without acute deficits on limited exam   4. HEENMT:  Head is atraumatic, normocephalic, pupils reactive to light, neck is supple, trachea is midline, mucous membranes are moist, NG tube in with brown no fluid output   5. Respiratory : Lungs are clear to auscultation bilaterally without wheezing, rhonchi, rales, no cyanosis, no increase in work of breathing or accessory muscle use   6. Cardiovascular : Heart rate normal, rhythm is regular,  no murmurs, rubs or gallops, no peripheral edema, peripheral pulses palpated   7. Gastrointestinal:  Patient is apprehensive about palpation of abdomen with some voluntary guarding, minimally tender to palpation bowel sounds active, no masses or organomegaly palpated   8. Skin:  Skin is warm,  dry and intact without rashes, acute lesions, or ulcers on limited exam   9.Musculoskeletal:  No acute deformities or trauma, no asymmetry in tone, no peripheral edema, peripheral pulses palpated, no tenderness to palpation in the extremities     Data Review:    CBC Recent Labs  Lab 12/30/20 1952 12/31/20 0018  WBC 19.6* 22.4*  HGB 15.4* 14.8  HCT 47.1* 45.2  PLT 448* 551*  MCV 90.4 90.0  MCH 29.6 29.5  MCHC 32.7 32.7  RDW 13.9 13.9  LYMPHSABS 1.9 1.2  MONOABS 0.6 0.8  EOSABS 0.1 0.0  BASOSABS 0.1 0.1   ------------------------------------------------------------------------------------------------------------------  Results for orders placed or performed during the hospital encounter of 12/30/20 (from the past 48 hour(s))  CBG monitoring, ED     Status: Abnormal   Collection Time: 12/30/20  6:53 PM  Result Value Ref Range   Glucose-Capillary 229 (H) 70 - 99 mg/dL    Comment: Glucose reference range applies only to samples taken after fasting for at least 8 hours.  CBC with Differential     Status: Abnormal   Collection Time: 12/30/20  7:52 PM  Result Value Ref Range   WBC 19.6 (H) 4.0 - 10.5 K/uL   RBC 5.21 (H) 3.87 - 5.11 MIL/uL   Hemoglobin 15.4 (H) 12.0 - 15.0 g/dL   HCT 14.7 (H) 82.9 - 56.2 %   MCV 90.4 80.0 - 100.0 fL   MCH 29.6 26.0 - 34.0 pg   MCHC 32.7 30.0 - 36.0 g/dL   RDW 13.0 86.5 - 78.4 %   Platelets 448 (H) 150 - 400 K/uL   nRBC 0.0 0.0 - 0.2 %   Neutrophils Relative % 86 %   Neutro Abs 16.9 (H) 1.7 - 7.7 K/uL   Lymphocytes Relative 10 %   Lymphs Abs 1.9 0.7 - 4.0 K/uL   Monocytes Relative 3 %   Monocytes Absolute 0.6 0.1 - 1.0 K/uL   Eosinophils  Relative 0 %   Eosinophils Absolute 0.1 0.0 - 0.5 K/uL   Basophils Relative 0 %   Basophils Absolute 0.1 0.0 - 0.1 K/uL   Immature Granulocytes 1 %   Abs Immature Granulocytes 0.11 (H) 0.00 - 0.07 K/uL    Comment: Performed at [facility], [address], [place], [place] [address]  Comprehensive metabolic panel     Status: Abnormal   Collection Time: 12/30/20  7:52 PM  Result Value Ref Range   Sodium 136 135 - 145 mmol/L   Potassium 3.4 (L) 3.5 - 5.1 mmol/L   Chloride 96 (L) 98 - 111 mmol/L   CO2 26 22 - 32 mmol/L   Glucose, Bld 226 (H) 70 - 99 mg/dL    Comment: Glucose reference range applies only to samples taken after fasting for at least 8 hours.   BUN 14 8 - 23 mg/dL   Creatinine, Ser 5.28 0.44 - 1.00 mg/dL   Calcium 41.3 (H) 8.9 - 10.3 mg/dL   Total Protein 8.9 (H) 6.5 - 8.1 g/dL   Albumin 5.0 3.5 - 5.0 g/dL   AST 22 15 - 41 U/L   ALT 18 0 - 44 U/L   Alkaline Phosphatase 88 38 - 126 U/L   Total Bilirubin 0.8 0.3 - 1.2 mg/dL   GFR, Estimated >24 >40 mL/min    Comment: (NOTE) Calculated using the CKD-EPI Creatinine Equation (2021)    Anion gap 14 5 - 15    Comment: Performed at [facility], [address], [place], [place]  [address]  Lipase, blood     Status: None   Collection Time: 12/30/20  7:52 PM  Result Value Ref Range   Lipase 25 11 - 51 U/L    Comment: Performed at [facility], [address], [place], [place] [address]  Resp Panel by RT-PCR (Flu A&B, Covid) Nasopharyngeal Swab     Status: None   Collection Time: 12/30/20 11:30 PM   Specimen: Nasopharyngeal Swab; Nasopharyngeal(NP) swabs in vial transport medium  Result Value Ref Range   SARS Coronavirus 2 by RT PCR NEGATIVE NEGATIVE    Comment: (NOTE) SARS-CoV-2 target nucleic acids are NOT DETECTED.  The SARS-CoV-2 RNA is generally detectable in upper respiratory specimens during the acute phase of infection. The lowest concentration of SARS-CoV-2 viral copies this assay can detect is 138 copies/mL. A  negative result does not preclude SARS-Cov-2 infection and should not be used as the sole basis for treatment or other patient management decisions. A negative result may occur with  improper specimen collection/handling, submission of specimen other than nasopharyngeal swab, presence of viral mutation(s) within the areas targeted by this assay, and inadequate number of viral copies(<138 copies/mL). A negative result must be combined with clinical observations, patient history, and epidemiological information. The expected result is Negative.  Fact Sheet for Patients:  BloggerCourse.com  Fact Sheet for Healthcare Providers:  SeriousBroker.it  This test is no t yet approved or cleared by the [place] FDA and  has been authorized for detection and/or diagnosis of SARS-CoV-2 by FDA under an Emergency Use Authorization (EUA). This EUA will remain  in effect (meaning this test can be used) for the duration of the COVID-19 declaration under Section 564(b)(1) of the Act, 21 U.S.C.section 360bbb-3(b)(1), unless the authorization is terminated  or revoked sooner.       Influenza A by PCR NEGATIVE NEGATIVE   Influenza B by PCR NEGATIVE NEGATIVE    Comment: (NOTE) The Xpert Xpress SARS-CoV-2/FLU/RSV plus assay is intended as an aid in the diagnosis of influenza from Nasopharyngeal swab specimens and should not be used as a sole basis for treatment. Nasal washings and aspirates are unacceptable for Xpert Xpress SARS-CoV-2/FLU/RSV testing.  Fact Sheet for Patients: BloggerCourse.com  Fact Sheet for Healthcare Providers: SeriousBroker.it  This test is not yet approved or cleared by the [place] FDA and has been authorized for detection and/or diagnosis of SARS-CoV-2 by FDA under an Emergency Use Authorization (EUA). This EUA will remain in effect (meaning this test can be used)  for the duration of the COVID-19 declaration under Section 564(b)(1) of the Act, 21 U.S.C. section 360bbb-3(b)(1), unless the authorization is terminated or revoked.  Performed at [facility], [address] [place], [place] [address]   Lactic acid, plasma     Status: None   Collection Time: 12/31/20 12:17 AM  Result Value Ref Range   Lactic Acid, Venous 1.8 0.5 - 1.9 mmol/L    Comment: Performed at [facility], [address] [place], [place] [address]  Comprehensive metabolic panel     Status: Abnormal   Collection Time: 12/31/20 12:18 AM  Result Value Ref Range   Sodium 135 135 - 145 mmol/L   Potassium 4.1 3.5 - 5.1 mmol/L    Comment: DELTA CHECK NOTED   Chloride 96 (L) 98 - 111 mmol/L   CO2 26 22 - 32 mmol/L   Glucose, Bld 200 (H) 70 - 99 mg/dL    Comment: Glucose reference range applies only to samples taken after fasting for  at least 8 hours.   BUN 16 8 - 23 mg/dL   Creatinine, Ser 9.48 0.44 - 1.00 mg/dL   Calcium 54.6 (H) 8.9 - 10.3 mg/dL   Total Protein 8.3 (H) 6.5 - 8.1 g/dL   Albumin 4.6 3.5 - 5.0 g/dL   AST 28 15 - 41 U/L   ALT 23 0 - 44 U/L   Alkaline Phosphatase 80 38 - 126 U/L   Total Bilirubin 0.6 0.3 - 1.2 mg/dL   GFR, Estimated >27 >03 mL/min    Comment: (NOTE) Calculated using the CKD-EPI Creatinine Equation (2021)    Anion gap 13 5 - 15    Comment: Performed at [facility], [address], [place], [place] [address]  Magnesium     Status: None   Collection Time: 12/31/20 12:18 AM  Result Value Ref Range   Magnesium 2.0 1.7 - 2.4 mg/dL    Comment: Performed at [facility], [address] [place], [place] [address]  CBC WITH DIFFERENTIAL     Status: Abnormal   Collection Time: 12/31/20 12:18 AM  Result Value Ref Range   WBC 22.4 (H) 4.0 - 10.5 K/uL   RBC 5.02 3.87 - 5.11 MIL/uL   Hemoglobin 14.8 12.0 - 15.0 g/dL   HCT 93.7 16.9 - 67.8 %   MCV 90.0 80.0 - 100.0 fL   MCH 29.5 26.0 - 34.0 pg   MCHC 32.7 30.0 - 36.0 g/dL   RDW 93.8 10.1 - 75.1 %    Platelets 551 (H) 150 - 400 K/uL   nRBC 0.0 0.0 - 0.2 %   Neutrophils Relative % 90 %   Neutro Abs 20.2 (H) 1.7 - 7.7 K/uL   Lymphocytes Relative 5 %   Lymphs Abs 1.2 0.7 - 4.0 K/uL   Monocytes Relative 4 %   Monocytes Absolute 0.8 0.1 - 1.0 K/uL   Eosinophils Relative 0 %   Eosinophils Absolute 0.0 0.0 - 0.5 K/uL   Basophils Relative 0 %   Basophils Absolute 0.1 0.0 - 0.1 K/uL   Immature Granulocytes 1 %   Abs Immature Granulocytes 0.12 (H) 0.00 - 0.07 K/uL    Comment: Performed at [facility], [address], [place], [place] [address]    Chemistries  Recent Labs  Lab 12/30/20 1952 12/31/20 0018  NA 136 135  K 3.4* 4.1  CL 96* 96*  CO2 26 26  GLUCOSE 226* 200*  BUN 14 16  CREATININE 0.90 0.88  CALCIUM 11.5* 11.1*  MG  --  2.0  AST 22 28  ALT 18 23  ALKPHOS 88 80  BILITOT 0.8 0.6   ------------------------------------------------------------------------------------------------------------------  ------------------------------------------------------------------------------------------------------------------ GFR: CrCl cannot be calculated (Unknown ideal weight.). Liver Function Tests: Recent Labs  Lab 12/30/20 1952 12/31/20 0018  AST 22 28  ALT 18 23  ALKPHOS 88 80  BILITOT 0.8 0.6  PROT 8.9* 8.3*  ALBUMIN 5.0 4.6   Recent Labs  Lab 12/30/20 1952  LIPASE 25   No results for input(s): AMMONIA in the last 168 hours. Coagulation Profile: No results for input(s): INR, PROTIME in the last 168 hours. Cardiac Enzymes: No results for input(s): CKTOTAL, CKMB, CKMBINDEX, TROPONINI in the last 168 hours. BNP (last 3 results) No results for input(s): PROBNP in the last 8760 hours. HbA1C: No results for input(s): HGBA1C in the last 72 hours. CBG: Recent Labs  Lab 12/30/20 1853  GLUCAP 229*   Lipid Profile: No results for input(s): CHOL, HDL, LDLCALC, TRIG, CHOLHDL, LDLDIRECT in the last  72 hours. Thyroid Function Tests: No results for input(s): TSH,  T4TOTAL, FREET4, T3FREE, THYROIDAB in the last 72 hours. Anemia Panel: No results for input(s): VITAMINB12, FOLATE, FERRITIN, TIBC, IRON, RETICCTPCT in the last 72 hours.  --------------------------------------------------------------------------------------------------------------- Urine analysis: No results found for: COLORURINE, APPEARANCEUR, LABSPEC, PHURINE, GLUCOSEU, HGBUR, BILIRUBINUR, KETONESUR, PROTEINUR, UROBILINOGEN, NITRITE, LEUKOCYTESUR    Imaging Results:    DG Abdomen 1 View  Result Date: 12/31/2020 CLINICAL DATA:  Check gastric catheter placement EXAM: ABDOMEN - 1 VIEW COMPARISON:  None. FINDINGS: Gastric catheter is noted within the stomach. Cardiac shadow is within normal limits. Contrast is noted within the collecting systems from recent CT. IMPRESSION: Gastric catheter within the stomach. Electronically Signed   By: Alcide Clever M.D.   On: 12/31/2020 00:07   CT Abdomen Pelvis W Contrast  Result Date: 12/30/2020 CLINICAL DATA:  Acute abdominal pain. EXAM: CT ABDOMEN AND PELVIS WITH CONTRAST TECHNIQUE: Multidetector CT imaging of the abdomen and pelvis was performed using the standard protocol following bolus administration of intravenous contrast. CONTRAST:  OMNIPAQUE IOHEXOL 300 MG/ML  SOLN COMPARISON:  Ultrasound abdomen 06/18/2012. FINDINGS: Lower chest: No acute abnormality. Hepatobiliary: There is a rounded hypodensity in the liver which is too small to characterize, likely a small hemangioma near the gallbladder fossa. No other liver lesions are identified. The gallbladder is surgically absent. There is no biliary ductal dilatation. Pancreas: There are punctate pancreatic calcifications likely related to chronic pancreatitis. There are no acute inflammatory changes. There is no pancreatic ductal dilatation. Spleen: Normal in size without focal abnormality. Adrenals/Urinary Tract: There is no hydronephrosis or perinephric fluid. There is cortical scarring in the  superior pole the left kidney. The adrenal glands and bladder are within normal limits. Stomach/Bowel: There is a short length segment small bowel loop with wall thickening and inflammation in the pelvis image 2/71. This is transition point for small bowel obstruction seen on image 2/65. Small bowel loops proximal to this level are dilated with air-fluid levels measuring up to 4 cm in diameter. There is mild associated mesenteric edema. There is no definite pneumatosis or free air. Additionally, the stomach is dilated with air-fluid level. Colon is decompressed. There are few scattered colonic diverticula. The appendix is within normal limits. Vascular/Lymphatic: Aortic atherosclerosis. No enlarged abdominal or pelvic lymph nodes. Reproductive: Status post hysterectomy. No adnexal masses. Other: There is a small amount of fluid adjacent to the distal esophagus, nonspecific. 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 Musculoskeletal: Degenerative changes affect the spine. IMPRESSION: 1. Small bowel wall thickening in the pelvis with surrounding inflammation worrisome for nonspecific enteritis. This is transition point for high-grade small bowel obstruction. There may be stricture at this level given CT appearance. Mesenteric edema is present which can be seen with vascular compromise. The stomach is also dilated. 2.  Other nonacute findings as above. Electronically Signed   By: Darliss Cheney M.D.   On: 12/30/2020 22:46       Assessment & Plan:    Principal Problem:   SBO (small bowel obstruction) (HCC) Active Problems:   Depression   Hypokalemia   Accelerated hypertension   Leukocytosis   Small bowel obstruction N.p.o. Pain control NG tube in place Dr. Lovell Sheehan to see in the a.m. Possibly related to stricture, more likely related to adhesions from previous surgeries Continue to monitor Depression Holding oral meds in the setting of SBO with NG tube Daily medications when patient  is able to tolerate p.o. Hypertension Holding all oral  meds in the setting of SBO.  Consider adding IV antihypertensives if needed Hypokalemia Replace and recheck Leukocytosis Likely acute phase reactant Continue current treatment and recheck in the a.m. Tobacco use disorder Patient has no interest in nicotine patch at this time Patient is in contemplative stage regarding quitting    DVT Prophylaxis-   Heparin - SCDs   AM Labs Ordered, also please review Full Orders  Family Communication: No family at bedside  Code Status:  FULL  Admission status:Inpatient :The appropriate admission status for this patient is INPATIENT. Inpatient status is judged to be reasonable and necessary in order to provide the required intensity of service to ensure the patient's safety. The patient's presenting symptoms, physical exam findings, and initial radiographic and laboratory data in the context of their chronic comorbidities is felt to place them at high risk for further clinical deterioration. Furthermore, it is not anticipated that the patient will be medically stable for discharge from the hospital within 2 midnights of admission. The following factors support the admission status of inpatient.     The patient's presenting symptoms include abdominal pain. The worrisome physical exam findings include abdominal tenderness. The initial radiographic and laboratory data are worrisome because of SBO. The chronic co-morbidities include hypertension.       * I certify that at the point of admission it is my clinical judgment that the patient will require inpatient hospital care spanning beyond 2 midnights from the point of admission due to high intensity of service, high risk for further deterioration and high frequency of surveillance required.*  Disposition: Anticipated Discharge date 48 - 72 hours Discharge to   Time spent in minutes : 65   Yuji Walth B Zierle-Ghosh DO

## 2021-01-01 ENCOUNTER — Inpatient Hospital Stay (HOSPITAL_COMMUNITY): Payer: Medicaid Other

## 2021-01-01 ENCOUNTER — Encounter (HOSPITAL_COMMUNITY): Payer: Self-pay | Admitting: Family Medicine

## 2021-01-01 DIAGNOSIS — K56609 Unspecified intestinal obstruction, unspecified as to partial versus complete obstruction: Secondary | ICD-10-CM | POA: Diagnosis not present

## 2021-01-01 LAB — CBC
HCT: 39.4 % (ref 36.0–46.0)
Hemoglobin: 13 g/dL (ref 12.0–15.0)
MCH: 29.9 pg (ref 26.0–34.0)
MCHC: 33 g/dL (ref 30.0–36.0)
MCV: 90.6 fL (ref 80.0–100.0)
Platelets: 455 10*3/uL — ABNORMAL HIGH (ref 150–400)
RBC: 4.35 MIL/uL (ref 3.87–5.11)
RDW: 14 % (ref 11.5–15.5)
WBC: 8.5 10*3/uL (ref 4.0–10.5)
nRBC: 0 % (ref 0.0–0.2)

## 2021-01-01 LAB — COMPREHENSIVE METABOLIC PANEL
ALT: 24 U/L (ref 0–44)
AST: 28 U/L (ref 15–41)
Albumin: 3.5 g/dL (ref 3.5–5.0)
Alkaline Phosphatase: 57 U/L (ref 38–126)
Anion gap: 7 (ref 5–15)
BUN: 12 mg/dL (ref 8–23)
CO2: 27 mmol/L (ref 22–32)
Calcium: 9.1 mg/dL (ref 8.9–10.3)
Chloride: 103 mmol/L (ref 98–111)
Creatinine, Ser: 0.65 mg/dL (ref 0.44–1.00)
GFR, Estimated: >60 mL/min (ref >60)
Glucose, Bld: 139 mg/dL — ABNORMAL HIGH (ref 70–99)
Potassium: 4.1 mmol/L (ref 3.5–5.1)
Sodium: 137 mmol/L (ref 135–145)
Total Bilirubin: 0.4 mg/dL (ref 0.3–1.2)
Total Protein: 6.5 g/dL (ref 6.5–8.1)

## 2021-01-01 LAB — MAGNESIUM: Magnesium: 1.9 mg/dL (ref 1.7–2.4)

## 2021-01-01 MED ORDER — DIATRIZOATE MEGLUMINE & SODIUM 66-10 % PO SOLN
ORAL | Status: AC
  Filled 2021-01-01: qty 90

## 2021-01-01 MED ORDER — DEXTROSE-NACL 5-0.45 % IV SOLN: INTRAVENOUS | Status: AC

## 2021-01-01 MED ORDER — MORPHINE SULFATE (PF) 4 MG/ML IV SOLN: 4.0000 mg | INTRAVENOUS | Status: DC | PRN

## 2021-01-01 MED ORDER — DIATRIZOATE MEGLUMINE & SODIUM 66-10 % PO SOLN
90.0000 mL | Freq: Once | ORAL | Status: AC
  Administered 2021-01-01: 12:00:00 90 mL via NASOGASTRIC

## 2021-01-01 MED ORDER — HYDROMORPHONE HCL 1 MG/ML IJ SOLN
1.0000 mg | INTRAMUSCULAR | Status: DC | PRN
  Administered 2021-01-01 – 2021-01-02 (×3): 1 mg via INTRAVENOUS
  Filled 2021-01-01 (×3): qty 1

## 2021-01-01 NOTE — Progress Notes (Signed)
Subjective: Patient has not had a bowel movement yet.  Minimal flatus noted.  Intermittent cramping noted.  Objective: Vital signs in last 24 hours: Temp:  [98.3 F (36.8 C)-99.3 F (37.4 C)] 99.3 F (37.4 C) (12/24 0510) Pulse Rate:  [75-96] 80 (12/24 0510) Resp:  [16-20] 16 (12/24 0510) BP: (128-138)/(46-62) 138/56 (12/24 0510) SpO2:  [92 %-100 %] 98 % (12/24 0510) Last BM Date: 12/29/20  Intake/Output from previous day: 12/23 0701 - 12/24 0700 In: 2017.3 [I.V.:1717.3; IV Piggyback:300] Out: 900 [Urine:300; Emesis/NG output:600] Intake/Output this shift: No intake/output data recorded.  General appearance: alert, cooperative, and no distress GI: Soft, minimally distended.  No rigidity is noted.  No point tenderness is noted.  Lab Results:  Recent Labs    12/31/20 0018 01/01/21 0518  WBC 22.4* 8.5  HGB 14.8 13.0  HCT 45.2 39.4  PLT 551* 455*   BMET Recent Labs    12/31/20 0018 01/01/21 0518  NA 135 137  K 4.1 4.1  CL 96* 103  CO2 26 27  GLUCOSE 200* 139*  BUN 16 12  CREATININE 0.88 0.65  CALCIUM 11.1* 9.1   PT/INR No results for input(s): LABPROT, INR in the last 72 hours.  Studies/Results: DG Abd 1 View  Result Date: 01/01/2021 CLINICAL DATA:  65 year old female with abdominal distension and pain. Small-bowel obstruction suspected on recent CT Abdomen and Pelvis. EXAM: ABDOMEN - 1 VIEW COMPARISON:  12/31/2020 and earlier. FINDINGS: Portable AP supine view at 0947 hours. Continued gas dilated small bowel loops in the mid abdomen. Enteric tube in place, side hole at the level of the gastric body. Negative visible left lung base. Stable cholecystectomy clips. No pneumoperitoneum identified on this supine view. On going paucity of large bowel gas. Pelvic phleboliths. No acute osseous abnormality identified. IMPRESSION: 1. Small-bowel obstruction, gas pattern not improved since 12/31/2019. 2. Stable enteric tube terminating in the stomach. Electronically  Signed   By: Odessa Fleming M.D.   On: 01/01/2021 10:04   Abd 1 View (KUB)  Result Date: 12/31/2020 CLINICAL DATA:  Lower abdominal pain.  SBO. EXAM: ABDOMEN - 1 VIEW COMPARISON:  KUB and CT AP, 12/30/2020. FINDINGS: Support lines: NG tube, incompletely imaged. Multiple air-and-fluid distended loops of small bowel within the central abdomen. Nondilated colon. No radiographic findings of intraperitoneal gas. Contrast layering within the urinary bladder. Cholecystectomy clips. Degenerative changes of spine. No interval osseous abnormality. IMPRESSION: Persistent findings of small-bowel obstruction. Electronically Signed   By: Roanna Banning M.D.   On: 12/31/2020 07:54   DG Abdomen 1 View  Result Date: 12/31/2020 CLINICAL DATA:  Check gastric catheter placement EXAM: ABDOMEN - 1 VIEW COMPARISON:  None. FINDINGS: Gastric catheter is noted within the stomach. Cardiac shadow is within normal limits. Contrast is noted within the collecting systems from recent CT. IMPRESSION: Gastric catheter within the stomach. Electronically Signed   By: Alcide Clever M.D.   On: 12/31/2020 00:07   CT Abdomen Pelvis W Contrast  Result Date: 12/30/2020 CLINICAL DATA:  Acute abdominal pain. EXAM: CT ABDOMEN AND PELVIS WITH CONTRAST TECHNIQUE: Multidetector CT imaging of the abdomen and pelvis was performed using the standard protocol following bolus administration of intravenous contrast. CONTRAST:  OMNIPAQUE IOHEXOL 300 MG/ML  SOLN COMPARISON:  Ultrasound abdomen 06/18/2012. FINDINGS: Lower chest: No acute abnormality. Hepatobiliary: There is a rounded hypodensity in the liver which is too small to characterize, likely a small hemangioma near the gallbladder fossa. No other liver lesions are identified. The gallbladder is surgically absent.  There is no biliary ductal dilatation. Pancreas: There are punctate pancreatic calcifications likely related to chronic pancreatitis. There are no acute inflammatory changes. There is no  pancreatic ductal dilatation. Spleen: Normal in size without focal abnormality. Adrenals/Urinary Tract: There is no hydronephrosis or perinephric fluid. There is cortical scarring in the superior pole the left kidney. The adrenal glands and bladder are within normal limits. Stomach/Bowel: There is a short length segment small bowel loop with wall thickening and inflammation in the pelvis image 2/71. This is transition point for small bowel obstruction seen on image 2/65. Small bowel loops proximal to this level are dilated with air-fluid levels measuring up to 4 cm in diameter. There is mild associated mesenteric edema. There is no definite pneumatosis or free air. Additionally, the stomach is dilated with air-fluid level. Colon is decompressed. There are few scattered colonic diverticula. The appendix is within normal limits. Vascular/Lymphatic: Aortic atherosclerosis. No enlarged abdominal or pelvic lymph nodes. Reproductive: Status post hysterectomy. No adnexal masses. Other: There is a small amount of fluid adjacent to the distal esophagus, nonspecific. 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 Musculoskeletal: Degenerative changes affect the spine. IMPRESSION: 1. Small bowel wall thickening in the pelvis with surrounding inflammation worrisome for nonspecific enteritis. This is transition point for high-grade small bowel obstruction. There may be stricture at this level given CT appearance. Mesenteric edema is present which can be seen with vascular compromise. The stomach is also dilated. 2.  Other nonacute findings as above. Electronically Signed   By: Darliss Cheney M.D.   On: 12/30/2020 22:46    Anti-infectives: Anti-infectives (From admission, onward)    Start     Dose/Rate Route Frequency Ordered Stop   12/31/20 1030  metroNIDAZOLE (FLAGYL) IVPB 500 mg        500 mg 100 mL/hr over 60 Minutes Intravenous Every 12 hours 12/31/20 1006     12/31/20 1030  cefTRIAXone (ROCEPHIN) 2 g in  sodium chloride 0.9 % 100 mL IVPB        2 g 200 mL/hr over 30 Minutes Intravenous Every 24 hours 12/31/20 1011     12/31/20 1015  cefTRIAXone (ROCEPHIN) 1 g in sodium chloride 0.9 % 100 mL IVPB  Status:  Discontinued        1 g 200 mL/hr over 30 Minutes Intravenous Every 24 hours 12/31/20 1006 12/31/20 1011       Assessment/Plan: Impression: Small bowel obstruction.  KUB this morning shows persistent dilatation of the bowel. Plan: We will get small bowel obstruction protocol study today.  Further management is pending those results.  LOS: 2 days    Franky Macho 01/01/2021

## 2021-01-01 NOTE — Progress Notes (Signed)
PROGRESS NOTE    Samantha Huynh  FOY:[id number] DOB: [birth year] DOA: 12/30/2020 PCP: Kari Baars, MD   Brief Narrative:  65 year old with history of ADHD, IBS, cholecystectomy, hysterectomy, manic depressive disorder comes to the hospital with complaints of abdominal pain that started day prior to admission.  CT in the ED was worrisome for nonspecific enteritis and high-grade bowel obstruction.  NG tube was placed, general surgery was consulted.   Assessment & Plan:   Principal Problem:   SBO (small bowel obstruction) (HCC) Active Problems:   Depression   Hypokalemia   Accelerated hypertension   Leukocytosis  High-grade small bowel obstruction Nonspecific enteritis - Suspect from adhesions from previous surgery.  NG tube in place for decompression.  Maintain NPO.  D5 half-normal saline.  Still having quite a bit abdominal cramps this morning, abdominal x-ray shows persistence of SBO.  Small bowel follow-through will be ordered by general surgery.  Appreciate input from Dr. Lovell Sheehan  Hypokalemia - Repletion as needed  Leukocytosis, resolved Nonspecific enteritis - Suspect it was likely reactive.  On empiric Rocephin/Flagyl.  If has diarrhea we can order stool studies  Urinary tract infection - Urine cultures-pending.  On empiric IV Rocephin  Essential hypertension - IV as needed medications.  P.o. on hold  Depression - Home meds on hold  Tobacco use - Nicotine patch as needed with patient is not interested     DVT prophylaxis: Subcu heparin Code Status: Full code Family Communication:    Status is: Inpatient  Remains inpatient appropriate because: NGT in place for SBO. Maintain hosp stay.  Still having significant amount of abdominal discomfort       Nutritional status           There is no height or weight on file to calculate BMI.           Subjective: Patient states she is burping a little bit but overall still has some abdominal  discomfort and cramping.  IV Dilaudid seems to be helping her little bit more   Examination: Constitutional: Not in acute distress Respiratory: Clear to auscultation bilaterally Cardiovascular: Normal sinus rhythm, no rubs Abdomen: Nontender nondistended.  Has some bowel sounds today Musculoskeletal: No edema noted Skin: No rashes seen Neurologic: CN 2-12 grossly intact.  And nonfocal Psychiatric: Normal judgment and insight. Alert and oriented x 3. Normal mood.  NGT in place.   Objective: Vitals:   12/31/20 1530 12/31/20 1930 12/31/20 2357 01/01/21 0510  BP: (!) 130/54 (!) 133/46 128/62 (!) 138/56  Pulse: 75 86 96 80  Resp: 20 20 18 16   Temp: 99.3 F (37.4 C) 99.1 F (37.3 C) 98.3 F (36.8 C) 99.3 F (37.4 C)  TempSrc: Oral Oral  Oral  SpO2: 92% 100% 98% 98%    Intake/Output Summary (Last 24 hours) at 01/01/2021 01/03/2021 Last data filed at 01/01/2021 01/03/2021 Gross per 24 hour  Intake 2017.33 ml  Output 900 ml  Net 1117.33 ml   There were no vitals filed for this visit.   Data Reviewed:   CBC: Recent Labs  Lab 12/30/20 1952 12/31/20 0018 01/01/21 0518  WBC 19.6* 22.4* 8.5  NEUTROABS 16.9* 20.2*  --   HGB 15.4* 14.8 13.0  HCT 47.1* 45.2 39.4  MCV 90.4 90.0 90.6  PLT 448* 551* 455*   Basic Metabolic Panel: Recent Labs  Lab 12/30/20 1952 12/31/20 0018 01/01/21 0518  NA 136 135 137  K 3.4* 4.1 4.1  CL 96* 96* 103  CO2 26 26 27  GLUCOSE 226* 200* 139*  BUN 14 16 12   CREATININE 0.90 0.88 0.65  CALCIUM 11.5* 11.1* 9.1  MG  --  2.0 1.9   GFR: CrCl cannot be calculated (Unknown ideal weight.). Liver Function Tests: Recent Labs  Lab 12/30/20 1952 12/31/20 0018 01/01/21 0518  AST 22 28 28   ALT 18 23 24   ALKPHOS 88 80 57  BILITOT 0.8 0.6 0.4  PROT 8.9* 8.3* 6.5  ALBUMIN 5.0 4.6 3.5   Recent Labs  Lab 12/30/20 1952  LIPASE 25   No results for input(s): AMMONIA in the last 168 hours. Coagulation Profile: No results for input(s): INR, PROTIME in  the last 168 hours. Cardiac Enzymes: No results for input(s): CKTOTAL, CKMB, CKMBINDEX, TROPONINI in the last 168 hours. BNP (last 3 results) No results for input(s): PROBNP in the last 8760 hours. HbA1C: No results for input(s): HGBA1C in the last 72 hours. CBG: Recent Labs  Lab 12/30/20 1853  GLUCAP 229*   Lipid Profile: No results for input(s): CHOL, HDL, LDLCALC, TRIG, CHOLHDL, LDLDIRECT in the last 72 hours. Thyroid Function Tests: No results for input(s): TSH, T4TOTAL, FREET4, T3FREE, THYROIDAB in the last 72 hours. Anemia Panel: No results for input(s): VITAMINB12, FOLATE, FERRITIN, TIBC, IRON, RETICCTPCT in the last 72 hours. Sepsis Labs: Recent Labs  Lab 12/31/20 0017 12/31/20 0307  LATICACIDVEN 1.8 1.7    Recent Results (from the past 240 hour(s))  Resp Panel by RT-PCR (Flu A&B, Covid) Nasopharyngeal Swab     Status: None   Collection Time: 12/30/20 11:30 PM   Specimen: Nasopharyngeal Swab; Nasopharyngeal(NP) swabs in vial transport medium  Result Value Ref Range Status   SARS Coronavirus 2 by RT PCR NEGATIVE NEGATIVE Final    Comment: (NOTE) SARS-CoV-2 target nucleic acids are NOT DETECTED.  The SARS-CoV-2 RNA is generally detectable in upper respiratory specimens during the acute phase of infection. The lowest concentration of SARS-CoV-2 viral copies this assay can detect is 138 copies/mL. A negative result does not preclude SARS-Cov-2 infection and should not be used as the sole basis for treatment or other patient management decisions. A negative result may occur with  improper specimen collection/handling, submission of specimen other than nasopharyngeal swab, presence of viral mutation(s) within the areas targeted by this assay, and inadequate number of viral copies(<138 copies/mL). A negative result must be combined with clinical observations, patient history, and epidemiological information. The expected result is Negative.  Fact Sheet for Patients:   01/02/21  Fact Sheet for Healthcare Providers:  0308  This test is no t yet approved or cleared by the 01/01/21 FDA and  has been authorized for detection and/or diagnosis of SARS-CoV-2 by FDA under an Emergency Use Authorization (EUA). This EUA will remain  in effect (meaning this test can be used) for the duration of the COVID-19 declaration under Section 564(b)(1) of the Act, 21 U.S.C.section 360bbb-3(b)(1), unless the authorization is terminated  or revoked sooner.       Influenza A by PCR NEGATIVE NEGATIVE Final   Influenza B by PCR NEGATIVE NEGATIVE Final    Comment: (NOTE) The Xpert Xpress SARS-CoV-2/FLU/RSV plus assay is intended as an aid in the diagnosis of influenza from Nasopharyngeal swab specimens and should not be used as a sole basis for treatment. Nasal washings and aspirates are unacceptable for Xpert Xpress SARS-CoV-2/FLU/RSV testing.  Fact Sheet for Patients: BloggerCourse.com  Fact Sheet for Healthcare Providers: SeriousBroker.it  This test is not yet approved or cleared by the [place] and  has been authorized for detection and/or diagnosis of SARS-CoV-2 by FDA under an Emergency Use Authorization (EUA). This EUA will remain in effect (meaning this test can be used) for the duration of the COVID-19 declaration under Section 564(b)(1) of the Act, 21 U.S.C. section 360bbb-3(b)(1), unless the authorization is terminated or revoked.  Performed at [facility], [address], [place], [place] [address]   Culture, blood (Routine X 2) w Reflex to ID Panel     Status: None (Preliminary result)   Collection Time: 12/31/20 10:12 AM   Specimen: BLOOD  Result Value Ref Range Status   Specimen Description BLOOD  Final   Special Requests NONE  Final   Culture   Final    NO GROWTH < 24 HOURS Performed at [facility], [address] [place], [place] [address]    Report Status PENDING  Incomplete  Culture, blood (Routine X 2) w Reflex to ID Panel     Status: None (Preliminary result)   Collection Time: 12/31/20 10:23 AM   Specimen: Left Antecubital; Blood  Result Value Ref Range Status   Specimen Description LEFT ANTECUBITAL  Final   Special Requests   Final    BOTTLES DRAWN AEROBIC AND ANAEROBIC Blood Culture adequate volume   Culture   Final    NO GROWTH < 24 HOURS Performed at [facility], [address] [place], [place] [address]    Report Status PENDING  Incomplete         Radiology Studies: Abd 1 View (KUB)  Result Date: 12/31/2020 CLINICAL DATA:  Lower abdominal pain.  SBO. EXAM: ABDOMEN - 1 VIEW COMPARISON:  KUB and CT AP, 12/30/2020. FINDINGS: Support lines: NG tube, incompletely imaged. Multiple air-and-fluid distended loops of small bowel within the central abdomen. Nondilated colon. No radiographic findings of intraperitoneal gas. Contrast layering within the urinary bladder. Cholecystectomy clips. Degenerative changes of spine. No interval osseous abnormality. IMPRESSION: Persistent findings of small-bowel obstruction. Electronically Signed   By: Roanna Banning M.D.   On: 12/31/2020 07:54   DG Abdomen 1 View  Result Date: 12/31/2020 CLINICAL DATA:  Check gastric catheter placement EXAM: ABDOMEN - 1 VIEW COMPARISON:  None. FINDINGS: Gastric catheter is noted within the stomach. Cardiac shadow is within normal limits. Contrast is noted within the collecting systems from recent CT. IMPRESSION: Gastric catheter within the stomach. Electronically Signed   By: Alcide Clever M.D.   On: 12/31/2020 00:07   CT Abdomen Pelvis W Contrast  Result Date: 12/30/2020 CLINICAL DATA:  Acute abdominal pain. EXAM: CT ABDOMEN AND PELVIS WITH CONTRAST TECHNIQUE: Multidetector CT imaging of the abdomen and pelvis was performed using the standard protocol following bolus administration of intravenous contrast. CONTRAST:   OMNIPAQUE IOHEXOL 300 MG/ML  SOLN COMPARISON:  Ultrasound abdomen 06/18/2012. FINDINGS: Lower chest: No acute abnormality. Hepatobiliary: There is a rounded hypodensity in the liver which is too small to characterize, likely a small hemangioma near the gallbladder fossa. No other liver lesions are identified. The gallbladder is surgically absent. There is no biliary ductal dilatation. Pancreas: There are punctate pancreatic calcifications likely related to chronic pancreatitis. There are no acute inflammatory changes. There is no pancreatic ductal dilatation. Spleen: Normal in size without focal abnormality. Adrenals/Urinary Tract: There is no hydronephrosis or perinephric fluid. There is cortical scarring in the superior pole the left kidney. The adrenal glands and bladder are within normal limits. Stomach/Bowel: There is a short length segment small bowel loop with wall thickening and inflammation in the pelvis  image 2/71. This is transition point for small bowel obstruction seen on image 2/65. Small bowel loops proximal to this level are dilated with air-fluid levels measuring up to 4 cm in diameter. There is mild associated mesenteric edema. There is no definite pneumatosis or free air. Additionally, the stomach is dilated with air-fluid level. Colon is decompressed. There are few scattered colonic diverticula. The appendix is within normal limits. Vascular/Lymphatic: Aortic atherosclerosis. No enlarged abdominal or pelvic lymph nodes. Reproductive: Status post hysterectomy. No adnexal masses. Other: There is a small amount of fluid adjacent to the distal esophagus, nonspecific. 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 Musculoskeletal: Degenerative changes affect the spine. IMPRESSION: 1. Small bowel wall thickening in the pelvis with surrounding inflammation worrisome for nonspecific enteritis. This is transition point for high-grade small bowel obstruction. There may be stricture at this  level given CT appearance. Mesenteric edema is present which can be seen with vascular compromise. The stomach is also dilated. 2.  Other nonacute findings as above. Electronically Signed   By: Darliss Cheney M.D.   On: 12/30/2020 22:46        Scheduled Meds:  heparin  5,000 Units Subcutaneous Q8H   Continuous Infusions:  cefTRIAXone (ROCEPHIN)  IV 2 g (01/01/21 0856)   dextrose 5 % and 0.45% NaCl     metronidazole Stopped (12/31/20 2207)     LOS: 2 days   Time spent= 35 mins    Rickey Sadowski Joline Maxcy, MD [facility]  If 7PM-7AM, please contact night-coverage  01/01/2021, 9:06 AM

## 2021-01-01 NOTE — Progress Notes (Signed)
Patient resting in bed. Patient ambulates to [facility] with no assistance. Patient voices complaints about pain and was addressed with hospitalist. Gen Sx at bedside.

## 2021-01-02 ENCOUNTER — Inpatient Hospital Stay (HOSPITAL_COMMUNITY): Payer: Medicaid Other

## 2021-01-02 DIAGNOSIS — K56609 Unspecified intestinal obstruction, unspecified as to partial versus complete obstruction: Secondary | ICD-10-CM | POA: Diagnosis not present

## 2021-01-02 LAB — CBC
HCT: 35.8 % — ABNORMAL LOW (ref 36.0–46.0)
Hemoglobin: 11.9 g/dL — ABNORMAL LOW (ref 12.0–15.0)
MCH: 30.4 pg (ref 26.0–34.0)
MCHC: 33.2 g/dL (ref 30.0–36.0)
MCV: 91.6 fL (ref 80.0–100.0)
Platelets: 388 10*3/uL (ref 150–400)
RBC: 3.91 MIL/uL (ref 3.87–5.11)
RDW: 13.8 % (ref 11.5–15.5)
WBC: 9.4 10*3/uL (ref 4.0–10.5)
nRBC: 0 % (ref 0.0–0.2)

## 2021-01-02 LAB — COMPREHENSIVE METABOLIC PANEL
ALT: 25 U/L (ref 0–44)
AST: 26 U/L (ref 15–41)
Albumin: 3.4 g/dL — ABNORMAL LOW (ref 3.5–5.0)
Alkaline Phosphatase: 53 U/L (ref 38–126)
Anion gap: 10 (ref 5–15)
BUN: 8 mg/dL (ref 8–23)
CO2: 26 mmol/L (ref 22–32)
Calcium: 9.3 mg/dL (ref 8.9–10.3)
Chloride: 103 mmol/L (ref 98–111)
Creatinine, Ser: 0.67 mg/dL (ref 0.44–1.00)
GFR, Estimated: >60 mL/min (ref >60)
Glucose, Bld: 107 mg/dL — ABNORMAL HIGH (ref 70–99)
Potassium: 4.9 mmol/L (ref 3.5–5.1)
Sodium: 139 mmol/L (ref 135–145)
Total Bilirubin: 0.4 mg/dL (ref 0.3–1.2)
Total Protein: 6.3 g/dL — ABNORMAL LOW (ref 6.5–8.1)

## 2021-01-02 LAB — URINE CULTURE

## 2021-01-02 LAB — MAGNESIUM: Magnesium: 1.9 mg/dL (ref 1.7–2.4)

## 2021-01-02 MED ORDER — MENTHOL 3 MG MT LOZG: 1.0000 | LOZENGE | OROMUCOSAL | Status: DC | PRN

## 2021-01-02 MED ORDER — BISACODYL 10 MG RE SUPP
10.0000 mg | Freq: Two times a day (BID) | RECTAL | Status: DC
  Administered 2021-01-02 – 2021-01-03 (×3): 10 mg via RECTAL
  Filled 2021-01-02 (×4): qty 1

## 2021-01-02 MED ORDER — MELATONIN 3 MG PO TABS
6.0000 mg | ORAL_TABLET | Freq: Every day | ORAL | Status: DC
  Administered 2021-01-03: 21:00:00 6 mg via ORAL
  Filled 2021-01-02: qty 2

## 2021-01-02 MED ORDER — DEXTROSE IN LACTATED RINGERS 5 % IV SOLN: INTRAVENOUS | Status: DC

## 2021-01-02 NOTE — Progress Notes (Signed)
Subjective: Is passing flatus but no bowel movement.  No abdominal pain noted.  Did have an episode of emesis around the NG tube.  Objective: Vital signs in last 24 hours: Temp:  [98.7 F (37.1 C)-99.3 F (37.4 C)] 99.3 F (37.4 C) (12/25 0455) Pulse Rate:  [73-77] 73 (12/25 0455) Resp:  [15-18] 18 (12/25 0455) BP: (127-132)/(58) 132/58 (12/25 0455) SpO2:  [98 %-99 %] 98 % (12/25 0455) Last BM Date: 12/29/20  Intake/Output from previous day: 12/24 0701 - 12/25 0700 In: 290 [NG/GT:90; IV Piggyback:200] Out: 1700 [Urine:900; Emesis/NG output:800] Intake/Output this shift: No intake/output data recorded.  General appearance: alert, cooperative, and no distress GI: Soft, nontender, nondistended.  Minimal bowel sounds appreciated.  Lab Results:  Recent Labs    01/01/21 0518 01/02/21 0437  WBC 8.5 9.4  HGB 13.0 11.9*  HCT 39.4 35.8*  PLT 455* 388   BMET Recent Labs    01/01/21 0518 01/02/21 0437  NA 137 139  K 4.1 4.9  CL 103 103  CO2 27 26  GLUCOSE 139* 107*  BUN 12 8  CREATININE 0.65 0.67  CALCIUM 9.1 9.3   PT/INR No results for input(s): LABPROT, INR in the last 72 hours.  Studies/Results: DG Abd 1 View  Result Date: 01/01/2021 CLINICAL DATA:  65 year old female with abdominal distension and pain. Small-bowel obstruction suspected on recent CT Abdomen and Pelvis. EXAM: ABDOMEN - 1 VIEW COMPARISON:  12/31/2020 and earlier. FINDINGS: Portable AP supine view at 0947 hours. Continued gas dilated small bowel loops in the mid abdomen. Enteric tube in place, side hole at the level of the gastric body. Negative visible left lung base. Stable cholecystectomy clips. No pneumoperitoneum identified on this supine view. On going paucity of large bowel gas. Pelvic phleboliths. No acute osseous abnormality identified. IMPRESSION: 1. Small-bowel obstruction, gas pattern not improved since 12/31/2019. 2. Stable enteric tube terminating in the stomach. Electronically Signed    By: Odessa Fleming M.D.   On: 01/01/2021 10:04   DG Abd Portable 1V-Small Bowel Obstruction Protocol-initial, 8 hr delay  Result Date: 01/01/2021 CLINICAL DATA:  8 hour delay bowel obstruction EXAM: PORTABLE ABDOMEN - 1 VIEW COMPARISON:  01/01/2021, 12/31/2020, 12/30/2020 FINDINGS: Esophageal tube tip and side port overlie the proximal stomach. Dilute contrast present within dilated small bowel, small bowel loops distended up to 3.5 cm. Possible small amount of dilute contrast in right colon, no contrast visible in the rectum. IMPRESSION: 1. Persistent small bowel distension with dilute contrast mostly within dilated small bowel. Possible small amount of dilute contrast in right colon. No convincing contrast is visualized in the rectum and 24 hour delayed image may be acquired as per history notes. Electronically Signed   By: Jasmine Pang M.D.   On: 01/01/2021 20:45    Anti-infectives: Anti-infectives (From admission, onward)    Start     Dose/Rate Route Frequency Ordered Stop   12/31/20 1030  metroNIDAZOLE (FLAGYL) IVPB 500 mg        500 mg 100 mL/hr over 60 Minutes Intravenous Every 12 hours 12/31/20 1006     12/31/20 1030  cefTRIAXone (ROCEPHIN) 2 g in sodium chloride 0.9 % 100 mL IVPB        2 g 200 mL/hr over 30 Minutes Intravenous Every 24 hours 12/31/20 1011     12/31/20 1015  cefTRIAXone (ROCEPHIN) 1 g in sodium chloride 0.9 % 100 mL IVPB  Status:  Discontinued        1 g 200 mL/hr over 30  Minutes Intravenous Every 24 hours 12/31/20 1006 12/31/20 1011       Assessment/Plan: Impression: Small bowel obstruction, persistent.  KUB yesterday evening revealed possible contrast in the right colon.  We will get 24-hour follow-up KUB.  If this does not resolve with NG tube decompression, patient will need exploratory laparotomy.  She understands this.  LOS: 3 days    Franky Macho 01/02/2021

## 2021-01-02 NOTE — Progress Notes (Signed)
Patient had emesis x 1, clear liquids. Patient stated she set off side of bed, hiccupped then had emesis. MD Nelson Chimes and Lovell Sheehan aware.

## 2021-01-02 NOTE — Progress Notes (Signed)
Checked placement by two verifiers this Clinical research associate and [employer], auscultation with 60 ml of air instilled via tube. NG tube flushed. NG tube functioning properly after flush.

## 2021-01-02 NOTE — Progress Notes (Signed)
PROGRESS NOTE    Samantha Huynh  ZOX:[id number] DOB: December 10, 1955 DOA: 12/30/2020 PCP: Kari Baars, MD   Brief Narrative:  65 year old with history of ADHD, IBS, cholecystectomy, hysterectomy, manic depressive disorder comes to the hospital with complaints of abdominal pain that started day prior to admission.  CT in the ED was worrisome for nonspecific enteritis and high-grade bowel obstruction.  NG tube was placed, general surgery was consulted.  Patient small bowel obstruction persist despite of small bowel follow-through.   Assessment & Plan:   Principal Problem:   SBO (small bowel obstruction) (HCC) Active Problems:   Depression   Hypokalemia   Accelerated hypertension   Leukocytosis  High-grade small bowel obstruction Nonspecific enteritis - Suspect from adhesions from previous surgery.  NG tube in place for decompression.  Maintain NPO.  D5 half-normal saline.  IV Dilaudid for pain control.  Small bowel follow-through performed, intestinal distention persist.  Appreciate input from general surgery, Dr. Lovell Sheehan. May Need Ex-Lap. Repeat AXR later today.   Hypokalemia - Repletion as needed  Leukocytosis, resolved Nonspecific enteritis - Suspect it was likely reactive.  On empiric Rocephin/Flagyl.  We will plan for total of 7 days.  If has diarrhea we can order stool studies  Urinary tract infection - Urine cultures-growing multiple species.  Empiric IV Rocephin  Essential hypertension - IV as needed medications.  P.o. on hold  Depression - Home meds on hold  Tobacco use - Nicotine patch as needed with patient is not interested     DVT prophylaxis: Subcu heparin Code Status: Full code Family Communication:  Patient to update.   Status is: Inpatient  Remains inpatient appropriate because: NG tube in place, SBO persists.  Maintain hospital stay.       Subjective: She is burping and passing minimal gas. No abd pain.   Examination: Constitutional: Not  in acute distress Respiratory: Clear to auscultation bilaterally Cardiovascular: Normal sinus rhythm, no rubs Abdomen: Nontender nondistended good bowel sounds Musculoskeletal: No edema noted Skin: No rashes seen Neurologic: CN 2-12 grossly intact.  And nonfocal Psychiatric: Normal judgment and insight. Alert and oriented x 3. Normal mood.   NGT in place.   Objective: Vitals:   12/31/20 2357 01/01/21 0510 01/01/21 1500 01/02/21 0455  BP: 128/62 (!) 138/56 (!) 127/58 (!) 132/58  Pulse: 96 80 77 73  Resp: Temp: 98.3 F (36.8 C) 99.3 F (37.4 C) 98.7 F (37.1 C) 99.3 F (37.4 C)  TempSrc:  Oral  Oral  SpO2: 98% 98% 99% 98%    Intake/Output Summary (Last 24 hours) at 01/02/2021 0802 Last data filed at 01/02/2021 0543 Gross per 24 hour  Intake 290 ml  Output 1700 ml  Net -1410 ml   There were no vitals filed for this visit.   Data Reviewed:   CBC: Recent Labs  Lab 12/30/20 1952 12/31/20 0018 01/01/21 0518 01/02/21 0437  WBC 19.6* 22.4* 8.5 9.4  NEUTROABS 16.9* 20.2*  --   --   HGB 15.4* 14.8 13.0 11.9*  HCT 47.1* 45.2 39.4 35.8*  MCV 90.4 90.0 90.6 91.6  PLT 448* 551* 455* 388   Basic Metabolic Panel: Recent Labs  Lab 12/30/20 1952 12/31/20 0018 01/01/21 0518 01/02/21 0437  NA 136 135 137 139  K 3.4* 4.1 4.1 4.9  CL 96* 96* 103 103  CO2 GLUCOSE 226* 200* 139* 107*  BUN CREATININE 0.90 0.88 0.65 0.67  CALCIUM 11.5* 11.1*  9.1 9.3  MG  --  2.0 1.9 1.9   GFR: CrCl cannot be calculated (Unknown ideal weight.). Liver Function Tests: Recent Labs  Lab 12/30/20 1952 12/31/20 0018 01/01/21 0518 01/02/21 0437  AST 22 28 28 26   ALT 18 23 24 25   ALKPHOS 88 80 57 53  BILITOT 0.8 0.6 0.4 0.4  PROT 8.9* 8.3* 6.5 6.3*  ALBUMIN 5.0 4.6 3.5 3.4*   Recent Labs  Lab 12/30/20 1952  LIPASE 25   No results for input(s): AMMONIA in the last 168 hours. Coagulation Profile: No results for input(s): INR, PROTIME in the  last 168 hours. Cardiac Enzymes: No results for input(s): CKTOTAL, CKMB, CKMBINDEX, TROPONINI in the last 168 hours. BNP (last 3 results) No results for input(s): PROBNP in the last 8760 hours. HbA1C: No results for input(s): HGBA1C in the last 72 hours. CBG: Recent Labs  Lab 12/30/20 1853  GLUCAP 229*   Lipid Profile: No results for input(s): CHOL, HDL, LDLCALC, TRIG, CHOLHDL, LDLDIRECT in the last 72 hours. Thyroid Function Tests: No results for input(s): TSH, T4TOTAL, FREET4, T3FREE, THYROIDAB in the last 72 hours. Anemia Panel: No results for input(s): VITAMINB12, FOLATE, FERRITIN, TIBC, IRON, RETICCTPCT in the last 72 hours. Sepsis Labs: Recent Labs  Lab 12/31/20 0017 12/31/20 0307  LATICACIDVEN 1.8 1.7    Recent Results (from the past 240 hour(s))  Resp Panel by RT-PCR (Flu A&B, Covid) Nasopharyngeal Swab     Status: None   Collection Time: 12/30/20 11:30 PM   Specimen: Nasopharyngeal Swab; Nasopharyngeal(NP) swabs in vial transport medium  Result Value Ref Range Status   SARS Coronavirus 2 by RT PCR NEGATIVE NEGATIVE Final    Comment: (NOTE) SARS-CoV-2 target nucleic acids are NOT DETECTED.  The SARS-CoV-2 RNA is generally detectable in upper respiratory specimens during the acute phase of infection. The lowest concentration of SARS-CoV-2 viral copies this assay can detect is 138 copies/mL. A negative result does not preclude SARS-Cov-2 infection and should not be used as the sole basis for treatment or other patient management decisions. A negative result may occur with  improper specimen collection/handling, submission of specimen other than nasopharyngeal swab, presence of viral mutation(s) within the areas targeted by this assay, and inadequate number of viral copies(<138 copies/mL). A negative result must be combined with clinical observations, patient history, and epidemiological information. The expected result is Negative.  Fact Sheet for Patients:   0308  Fact Sheet for Healthcare Providers:  01/01/21  This test is no t yet approved or cleared by the [employer] FDA and  has been authorized for detection and/or diagnosis of SARS-CoV-2 by FDA under an Emergency Use Authorization (EUA). This EUA will remain  in effect (meaning this test can be used) for the duration of the COVID-19 declaration under Section 564(b)(1) of the Act, 21 U.S.C.section 360bbb-3(b)(1), unless the authorization is terminated  or revoked sooner.       Influenza A by PCR NEGATIVE NEGATIVE Final   Influenza B by PCR NEGATIVE NEGATIVE Final    Comment: (NOTE) The Xpert Xpress SARS-CoV-2/FLU/RSV plus assay is intended as an aid in the diagnosis of influenza from Nasopharyngeal swab specimens and should not be used as a sole basis for treatment. Nasal washings and aspirates are unacceptable for Xpert Xpress SARS-CoV-2/FLU/RSV testing.  Fact Sheet for Patients: SeriousBroker.it  Fact Sheet for Healthcare Providers: [place]  This test is not yet approved or cleared by the [employer] FDA and has been authorized for detection and/or diagnosis of  SARS-CoV-2 by FDA under an Emergency Use Authorization (EUA). This EUA will remain in effect (meaning this test can be used) for the duration of the COVID-19 declaration under Section 564(b)(1) of the Act, 21 U.S.C. section 360bbb-3(b)(1), unless the authorization is terminated or revoked.  Performed at [facility], [address] [place], [place] [address]   Urine Culture     Status: Abnormal   Collection Time: 12/31/20  5:20 AM   Specimen: Urine, Clean Catch  Result Value Ref Range Status   Specimen Description   Final    URINE, CLEAN CATCH Performed at [facility], [address] [place], [place] [address]    Special Requests   Final    NONE Performed at [facility], [address] [place], [place] [address]    Culture MULTIPLE SPECIES PRESENT, SUGGEST RECOLLECTION (A)  Final   Report Status 01/02/2021 FINAL  Final  Culture, blood (Routine X 2) w Reflex to ID Panel     Status: None (Preliminary result)   Collection Time: 12/31/20 10:12 AM   Specimen: BLOOD  Result Value Ref Range Status   Specimen Description BLOOD  Final   Special Requests NONE  Final   Culture   Final    NO GROWTH < 24 HOURS Performed at [facility], [address] [place], [place] [address]    Report Status PENDING  Incomplete  Culture, blood (Routine X 2) w Reflex to ID Panel     Status: None (Preliminary result)   Collection Time: 12/31/20 10:23 AM   Specimen: Left Antecubital; Blood  Result Value Ref Range Status   Specimen Description LEFT ANTECUBITAL  Final   Special Requests   Final    BOTTLES DRAWN AEROBIC AND ANAEROBIC Blood Culture adequate volume   Culture   Final    NO GROWTH < 24 HOURS Performed at [facility], [address], [place], [place] [address]    Report Status PENDING  Incomplete         Radiology Studies: DG Abd 1 View  Result Date: 01/01/2021 CLINICAL DATA:  65 year old female with abdominal distension and pain. Small-bowel obstruction suspected on recent CT Abdomen and Pelvis. EXAM: ABDOMEN - 1 VIEW COMPARISON:  12/31/2020 and earlier. FINDINGS: Portable AP supine view at 0947 hours. Continued gas dilated small bowel loops in the mid abdomen. Enteric tube in place, side hole at the level of the gastric body. Negative visible left lung base. Stable cholecystectomy clips. No pneumoperitoneum identified on this supine view. On going paucity of large bowel gas. Pelvic phleboliths. No acute osseous abnormality identified. IMPRESSION: 1. Small-bowel obstruction, gas pattern not improved since 12/31/2019. 2. Stable enteric tube terminating in the stomach. Electronically Signed   By: Odessa Fleming M.D.   On: 01/01/2021 10:04   DG Abd Portable 1V-Small  Bowel Obstruction Protocol-initial, 8 hr delay  Result Date: 01/01/2021 CLINICAL DATA:  8 hour delay bowel obstruction EXAM: PORTABLE ABDOMEN - 1 VIEW COMPARISON:  01/01/2021, 12/31/2020, 12/30/2020 FINDINGS: Esophageal tube tip and side port overlie the proximal stomach. Dilute contrast present within dilated small bowel, small bowel loops distended up to 3.5 cm. Possible small amount of dilute contrast in right colon, no contrast visible in the rectum. IMPRESSION: 1. Persistent small bowel distension with dilute contrast mostly within dilated small bowel. Possible small amount of dilute contrast in right colon. No convincing contrast is visualized in the rectum and 24 hour delayed image may be acquired as per history notes. Electronically Signed   By:  Jasmine Pang M.D.   On: 01/01/2021 20:45        Scheduled Meds:  heparin  5,000 Units Subcutaneous Q8H   Continuous Infusions:  cefTRIAXone (ROCEPHIN)  IV 2 g (01/01/21 0856)   metronidazole Stopped (01/01/21 2207)     LOS: 3 days   Time spent= 35 mins    Kimothy Kishimoto Joline Maxcy, MD [facility]  If 7PM-7AM, please contact night-coverage  01/02/2021, 8:02 AM

## 2021-01-03 ENCOUNTER — Inpatient Hospital Stay (HOSPITAL_COMMUNITY): Payer: Medicaid Other

## 2021-01-03 DIAGNOSIS — K56609 Unspecified intestinal obstruction, unspecified as to partial versus complete obstruction: Secondary | ICD-10-CM | POA: Diagnosis not present

## 2021-01-03 LAB — CBC
HCT: 33.5 % — ABNORMAL LOW (ref 36.0–46.0)
Hemoglobin: 11.1 g/dL — ABNORMAL LOW (ref 12.0–15.0)
MCH: 29.4 pg (ref 26.0–34.0)
MCHC: 33.1 g/dL (ref 30.0–36.0)
MCV: 88.6 fL (ref 80.0–100.0)
Platelets: 356 10*3/uL (ref 150–400)
RBC: 3.78 MIL/uL — ABNORMAL LOW (ref 3.87–5.11)
RDW: 13.3 % (ref 11.5–15.5)
WBC: 9.8 10*3/uL (ref 4.0–10.5)
nRBC: 0 % (ref 0.0–0.2)

## 2021-01-03 LAB — MAGNESIUM: Magnesium: 1.8 mg/dL (ref 1.7–2.4)

## 2021-01-03 LAB — COMPREHENSIVE METABOLIC PANEL
ALT: 23 U/L (ref 0–44)
AST: 24 U/L (ref 15–41)
Albumin: 3.2 g/dL — ABNORMAL LOW (ref 3.5–5.0)
Alkaline Phosphatase: 49 U/L (ref 38–126)
Anion gap: 13 (ref 5–15)
BUN: 6 mg/dL — ABNORMAL LOW (ref 8–23)
CO2: 23 mmol/L (ref 22–32)
Calcium: 9 mg/dL (ref 8.9–10.3)
Chloride: 101 mmol/L (ref 98–111)
Creatinine, Ser: 0.5 mg/dL (ref 0.44–1.00)
GFR, Estimated: >60 mL/min (ref >60)
Glucose, Bld: 116 mg/dL — ABNORMAL HIGH (ref 70–99)
Potassium: 3 mmol/L — ABNORMAL LOW (ref 3.5–5.1)
Sodium: 137 mmol/L (ref 135–145)
Total Bilirubin: 0.3 mg/dL (ref 0.3–1.2)
Total Protein: 5.9 g/dL — ABNORMAL LOW (ref 6.5–8.1)

## 2021-01-03 MED ORDER — POTASSIUM CHLORIDE 10 MEQ/100ML IV SOLN
10.0000 meq | INTRAVENOUS | Status: AC
  Administered 2021-01-03 (×6): 10 meq via INTRAVENOUS
  Filled 2021-01-03 (×6): qty 100

## 2021-01-03 NOTE — Progress Notes (Signed)
PROGRESS NOTE    Samantha Huynh  CXK:[id number] DOB: 06/21/1955 DOA: 12/30/2020 PCP: Kari Baars, MD   Brief Narrative:  65 year old with history of ADHD, IBS, cholecystectomy, hysterectomy, manic depressive disorder comes to the hospital with complaints of abdominal pain that started day prior to admission.  CT in the ED was worrisome for nonspecific enteritis and high-grade bowel obstruction.  NG tube was placed, general surgery was consulted.  Patient small bowel obstruction persist despite of small bowel follow-through.   Assessment & Plan:   Principal Problem:   SBO (small bowel obstruction) (HCC) Active Problems:   Depression   Hypokalemia   Accelerated hypertension   Leukocytosis  High-grade small bowel obstruction Nonspecific enteritis -Symptoms are improving, she is having bowel movement and passing gas.  NG tube removed this morning, full liquid diet.  Appreciate input from general surgery, Dr. Lovell Sheehan.  X-ray appears to be improved this morning.  Hopefully we can avoid any surgeries She has been advised to ambulate as much as possible.  We will go and stop her IV fluids and watch her oral intake.  Hypokalemia - Replete aggressively.  Leukocytosis, resolved Nonspecific enteritis - Suspect it was likely reactive.  On empiric Rocephin/Flagyl.  We will plan for total of 7 days.  If has diarrhea we can order stool studies  Urinary tract infection - Urine cultures-growing multiple species.  Empiric IV Rocephin  Essential hypertension - IV as needed medications.  P.o. on hold  Depression - Home meds on hold  Tobacco use - Nicotine patch as needed with patient is not interested     DVT prophylaxis: Subcu heparin Code Status: Full code Family Communication:    Status is: Inpatient  Remains inpatient appropriate because: NG tube removed this morning, plan to slowly advance diet today.  If she tolerates hopefully we can discharge her next 24-48  hours   Subjective: She is passing gas feeling better this morning.  NG tube removed by surgery team.  Examination: Constitutional: Not in acute distress Respiratory: Clear to auscultation bilaterally Cardiovascular: Normal sinus rhythm, no rubs Abdomen: Nontender nondistended good bowel sounds Musculoskeletal: No edema noted Skin: No rashes seen Neurologic: CN 2-12 grossly intact.  And nonfocal Psychiatric: Normal judgment and insight. Alert and oriented x 3. Normal mood.   Objective: Vitals:   01/02/21 0455 01/02/21 2110 01/02/21 2112 01/03/21 0435  BP: (!) 132/58 (!) 126/111 (!) 150/59 133/62  Pulse: 73 74 67 65  Resp: 18 19  19   Temp: 99.3 F (37.4 C) 98.3 F (36.8 C)  99.7 F (37.6 C)  TempSrc: Oral Oral  Oral  SpO2: 98% 96%  96%    Intake/Output Summary (Last 24 hours) at 01/03/2021 0836 Last data filed at 01/03/2021 0435 Gross per 24 hour  Intake 619.15 ml  Output 1152 ml  Net -532.85 ml   There were no vitals filed for this visit.   Data Reviewed:   CBC: Recent Labs  Lab 12/30/20 1952 12/31/20 0018 01/01/21 0518 01/02/21 0437 01/03/21 0440  WBC 19.6* 22.4* 8.5 9.4 9.8  NEUTROABS 16.9* 20.2*  --   --   --   HGB 15.4* 14.8 13.0 11.9* 11.1*  HCT 47.1* 45.2 39.4 35.8* 33.5*  MCV 90.4 90.0 90.6 91.6 88.6  PLT 448* 551* 455* 388 356   Basic Metabolic Panel: Recent Labs  Lab 12/30/20 1952 12/31/20 0018 01/01/21 0518 01/02/21 0437 01/03/21 0440  NA 136 135 137 139 137  K 3.4* 4.1 4.1 4.9 3.0*  CL 96*  96* 103 103 101  CO2 26 26 27 26 23   GLUCOSE 226* 200* 139* 107* 116*  BUN 14 16 12 8  6*  CREATININE 0.90 0.88 0.65 0.67 0.50  CALCIUM 11.5* 11.1* 9.1 9.3 9.0  MG  --  2.0 1.9 1.9 1.8   GFR: CrCl cannot be calculated (Unknown ideal weight.). Liver Function Tests: Recent Labs  Lab 12/30/20 1952 12/31/20 0018 01/01/21 0518 01/02/21 0437 01/03/21 0440  AST 22 28 28 26 24   ALT 18 23 24 25 23   ALKPHOS 88 80 57 53 49  BILITOT 0.8 0.6 0.4  0.4 0.3  PROT 8.9* 8.3* 6.5 6.3* 5.9*  ALBUMIN 5.0 4.6 3.5 3.4* 3.2*   Recent Labs  Lab 12/30/20 1952  LIPASE 25   No results for input(s): AMMONIA in the last 168 hours. Coagulation Profile: No results for input(s): INR, PROTIME in the last 168 hours. Cardiac Enzymes: No results for input(s): CKTOTAL, CKMB, CKMBINDEX, TROPONINI in the last 168 hours. BNP (last 3 results) No results for input(s): PROBNP in the last 8760 hours. HbA1C: No results for input(s): HGBA1C in the last 72 hours. CBG: Recent Labs  Lab 12/30/20 1853  GLUCAP 229*   Lipid Profile: No results for input(s): CHOL, HDL, LDLCALC, TRIG, CHOLHDL, LDLDIRECT in the last 72 hours. Thyroid Function Tests: No results for input(s): TSH, T4TOTAL, FREET4, T3FREE, THYROIDAB in the last 72 hours. Anemia Panel: No results for input(s): VITAMINB12, FOLATE, FERRITIN, TIBC, IRON, RETICCTPCT in the last 72 hours. Sepsis Labs: Recent Labs  Lab 12/31/20 0017 12/31/20 0307  LATICACIDVEN 1.8 1.7    Recent Results (from the past 240 hour(s))  Resp Panel by RT-PCR (Flu A&B, Covid) Nasopharyngeal Swab     Status: None   Collection Time: 12/30/20 11:30 PM   Specimen: Nasopharyngeal Swab; Nasopharyngeal(NP) swabs in vial transport medium  Result Value Ref Range Status   SARS Coronavirus 2 by RT PCR NEGATIVE NEGATIVE Final    Comment: (NOTE) SARS-CoV-2 target nucleic acids are NOT DETECTED.  The SARS-CoV-2 RNA is generally detectable in upper respiratory specimens during the acute phase of infection. The lowest concentration of SARS-CoV-2 viral copies this assay can detect is 138 copies/mL. A negative result does not preclude SARS-Cov-2 infection and should not be used as the sole basis for treatment or other patient management decisions. A negative result may occur with  improper specimen collection/handling, submission of specimen other than nasopharyngeal swab, presence of viral mutation(s) within the areas targeted by  this assay, and inadequate number of viral copies(<138 copies/mL). A negative result must be combined with clinical observations, patient history, and epidemiological information. The expected result is Negative.  Fact Sheet for Patients:  01/02/21  Fact Sheet for Healthcare Providers:  01/02/21  This test is no t yet approved or cleared by the [employer] FDA and  has been authorized for detection and/or diagnosis of SARS-CoV-2 by FDA under an Emergency Use Authorization (EUA). This EUA will remain  in effect (meaning this test can be used) for the duration of the COVID-19 declaration under Section 564(b)(1) of the Act, 21 U.S.C.section 360bbb-3(b)(1), unless the authorization is terminated  or revoked sooner.       Influenza A by PCR NEGATIVE NEGATIVE Final   Influenza B by PCR NEGATIVE NEGATIVE Final    Comment: (NOTE) The Xpert Xpress SARS-CoV-2/FLU/RSV plus assay is intended as an aid in the diagnosis of influenza from Nasopharyngeal swab specimens and should not be used as a sole basis for treatment. Nasal  washings and aspirates are unacceptable for Xpert Xpress SARS-CoV-2/FLU/RSV testing.  Fact Sheet for Patients: BloggerCourse.com  Fact Sheet for Healthcare Providers: SeriousBroker.it  This test is not yet approved or cleared by the [place] FDA and has been authorized for detection and/or diagnosis of SARS-CoV-2 by FDA under an Emergency Use Authorization (EUA). This EUA will remain in effect (meaning this test can be used) for the duration of the COVID-19 declaration under Section 564(b)(1) of the Act, 21 U.S.C. section 360bbb-3(b)(1), unless the authorization is terminated or revoked.  Performed at [facility], [address], [place], [place] [address]   Urine Culture     Status: Abnormal   Collection Time: 12/31/20  5:20 AM   Specimen:  Urine, Clean Catch  Result Value Ref Range Status   Specimen Description   Final    URINE, CLEAN CATCH Performed at [facility], [address] [place], [place] [address]    Special Requests   Final    NONE Performed at [facility], [address] [place], [place] [address]    Culture MULTIPLE SPECIES PRESENT, SUGGEST RECOLLECTION (A)  Final   Report Status 01/02/2021 FINAL  Final  Culture, blood (Routine X 2) w Reflex to ID Panel     Status: None (Preliminary result)   Collection Time: 12/31/20 10:12 AM   Specimen: Right Antecubital; Blood  Result Value Ref Range Status   Specimen Description RIGHT ANTECUBITAL  Final   Special Requests   Final    BOTTLES DRAWN AEROBIC AND ANAEROBIC Blood Culture adequate volume   Culture   Final    NO GROWTH 3 DAYS Performed at [facility], [address], [place], [place] [address]    Report Status PENDING  Incomplete  Culture, blood (Routine X 2) w Reflex to ID Panel     Status: None (Preliminary result)   Collection Time: 12/31/20 10:23 AM   Specimen: Left Antecubital; Blood  Result Value Ref Range Status   Specimen Description LEFT ANTECUBITAL  Final   Special Requests   Final    BOTTLES DRAWN AEROBIC AND ANAEROBIC Blood Culture adequate volume   Culture   Final    NO GROWTH 3 DAYS Performed at [facility], [address], [place], [place] [address]    Report Status PENDING  Incomplete         Radiology Studies: DG Abd 1 View  Result Date: 01/03/2021 CLINICAL DATA:  65 year old female with small bowel obstruction suspected on CT Abdomen and Pelvis. No improvement in bowel-gas pattern after 2 days. Status post oral contrast on 01/01/2021. EXAM: ABDOMEN - 1 VIEW COMPARISON:  01/02/2021 and earlier. FINDINGS: Portable AP supine view at 0411 hours. Stable enteric tube. Oral contrast unchanged throughout the large bowel to the distal descending colon. Gas-filled dilated small bowel loops in the left abdomen persist but have slightly  regressed since 01/01/2021. Negative lung bases. No definite pneumoperitoneum on this supine view. Stable cholecystectomy clips. Stable visualized osseous structures. IMPRESSION: Partial small bowel obstruction. Slightly improved bowel gas pattern since 01/01/2021. Electronically Signed   By: Odessa Fleming M.D.   On: 01/03/2021 04:57   DG Abd 1 View  Result Date: 01/02/2021 CLINICAL DATA:  Small-bowel obstruction.  24 hour delayed film EXAM: ABDOMEN - 1 VIEW COMPARISON:  01/01/2021 FINDINGS: Enteric tube remains coiled within the stomach. Persistently dilated loops of small bowel within the abdomen, similar in caliber to the previous exam. Interval progression of oral contrast, now seen throughout the colon. No extraluminal contrast  collections. No gross free intraperitoneal air on AP supine view. IMPRESSION: Persistently dilated loops of small bowel within the abdomen, similar in caliber to the previous exam. Interval progression of oral contrast, now seen throughout the colon, compatible with partial or intermittent small bowel obstruction. Electronically Signed   By: Duanne Guess D.O.   On: 01/02/2021 13:02   DG Abd 1 View  Result Date: 01/01/2021 CLINICAL DATA:  65 year old female with abdominal distension and pain. Small-bowel obstruction suspected on recent CT Abdomen and Pelvis. EXAM: ABDOMEN - 1 VIEW COMPARISON:  12/31/2020 and earlier. FINDINGS: Portable AP supine view at 0947 hours. Continued gas dilated small bowel loops in the mid abdomen. Enteric tube in place, side hole at the level of the gastric body. Negative visible left lung base. Stable cholecystectomy clips. No pneumoperitoneum identified on this supine view. On going paucity of large bowel gas. Pelvic phleboliths. No acute osseous abnormality identified. IMPRESSION: 1. Small-bowel obstruction, gas pattern not improved since 12/31/2019. 2. Stable enteric tube terminating in the stomach. Electronically Signed   By: Odessa Fleming M.D.   On:  01/01/2021 10:04   DG Abd Portable 1V-Small Bowel Obstruction Protocol-initial, 8 hr delay  Result Date: 01/01/2021 CLINICAL DATA:  8 hour delay bowel obstruction EXAM: PORTABLE ABDOMEN - 1 VIEW COMPARISON:  01/01/2021, 12/31/2020, 12/30/2020 FINDINGS: Esophageal tube tip and side port overlie the proximal stomach. Dilute contrast present within dilated small bowel, small bowel loops distended up to 3.5 cm. Possible small amount of dilute contrast in right colon, no contrast visible in the rectum. IMPRESSION: 1. Persistent small bowel distension with dilute contrast mostly within dilated small bowel. Possible small amount of dilute contrast in right colon. No convincing contrast is visualized in the rectum and 24 hour delayed image may be acquired as per history notes. Electronically Signed   By: Jasmine Pang M.D.   On: 01/01/2021 20:45        Scheduled Meds:  bisacodyl  10 mg Rectal BID   heparin  5,000 Units Subcutaneous Q8H   melatonin  6 mg Oral QHS   Continuous Infusions:  cefTRIAXone (ROCEPHIN)  IV Stopped (01/02/21 1017)   dextrose 5% lactated ringers 100 mL/hr at 01/02/21 2324   metronidazole 500 mg (01/02/21 2205)     LOS: 4 days   Time spent= 35 mins    Zuhayr Deeney Joline Maxcy, MD [facility]  If 7PM-7AM, please contact night-coverage  01/03/2021, 8:36 AM

## 2021-01-03 NOTE — Progress Notes (Signed)
Subjective: Patient is passing flatus and having bowel movements.  Objective: Vital signs in last 24 hours: Temp:  [98.3 F (36.8 C)-99.7 F (37.6 C)] 99.7 F (37.6 C) (12/26 0435) Pulse Rate:  [65-74] 65 (12/26 0435) Resp:  [19] 19 (12/26 0435) BP: (126-150)/(59-111) 133/62 (12/26 0435) SpO2:  [96 %] 96 % (12/26 0435) Last BM Date: 12/29/20  Intake/Output from previous day: 12/25 0701 - 12/26 0700 In: 619.2 [I.V.:319.2; IV Piggyback:300] Out: 1152 [Emesis/NG output:1150; Stool:2] Intake/Output this shift: Total I/O In: 0  Out: 2 [Urine:1; Stool:1]  General appearance: alert, cooperative, and no distress GI: soft, non-tender; bowel sounds normal; no masses,  no organomegaly  Lab Results:  Recent Labs    01/02/21 0437 01/03/21 0440  WBC 9.4 9.8  HGB 11.9* 11.1*  HCT 35.8* 33.5*  PLT 388 356   BMET Recent Labs    01/02/21 0437 01/03/21 0440  NA 139 137  K 4.9 3.0*  CL 103 101  CO2 26 23  GLUCOSE 107* 116*  BUN 8 6*  CREATININE 0.67 0.50  CALCIUM 9.3 9.0   PT/INR No results for input(s): LABPROT, INR in the last 72 hours.  Studies/Results: DG Abd 1 View  Result Date: 01/03/2021 CLINICAL DATA:  65 year old female with small bowel obstruction suspected on CT Abdomen and Pelvis. No improvement in bowel-gas pattern after 2 days. Status post oral contrast on 01/01/2021. EXAM: ABDOMEN - 1 VIEW COMPARISON:  01/02/2021 and earlier. FINDINGS: Portable AP supine view at 0411 hours. Stable enteric tube. Oral contrast unchanged throughout the large bowel to the distal descending colon. Gas-filled dilated small bowel loops in the left abdomen persist but have slightly regressed since 01/01/2021. Negative lung bases. No definite pneumoperitoneum on this supine view. Stable cholecystectomy clips. Stable visualized osseous structures. IMPRESSION: Partial small bowel obstruction. Slightly improved bowel gas pattern since 01/01/2021. Electronically Signed   By: Odessa Fleming M.D.    On: 01/03/2021 04:57   DG Abd 1 View  Result Date: 01/02/2021 CLINICAL DATA:  Small-bowel obstruction.  24 hour delayed film EXAM: ABDOMEN - 1 VIEW COMPARISON:  01/01/2021 FINDINGS: Enteric tube remains coiled within the stomach. Persistently dilated loops of small bowel within the abdomen, similar in caliber to the previous exam. Interval progression of oral contrast, now seen throughout the colon. No extraluminal contrast collections. No gross free intraperitoneal air on AP supine view. IMPRESSION: Persistently dilated loops of small bowel within the abdomen, similar in caliber to the previous exam. Interval progression of oral contrast, now seen throughout the colon, compatible with partial or intermittent small bowel obstruction. Electronically Signed   By: Duanne Guess D.O.   On: 01/02/2021 13:02   DG Abd Portable 1V-Small Bowel Obstruction Protocol-initial, 8 hr delay  Result Date: 01/01/2021 CLINICAL DATA:  8 hour delay bowel obstruction EXAM: PORTABLE ABDOMEN - 1 VIEW COMPARISON:  01/01/2021, 12/31/2020, 12/30/2020 FINDINGS: Esophageal tube tip and side port overlie the proximal stomach. Dilute contrast present within dilated small bowel, small bowel loops distended up to 3.5 cm. Possible small amount of dilute contrast in right colon, no contrast visible in the rectum. IMPRESSION: 1. Persistent small bowel distension with dilute contrast mostly within dilated small bowel. Possible small amount of dilute contrast in right colon. No convincing contrast is visualized in the rectum and 24 hour delayed image may be acquired as per history notes. Electronically Signed   By: Jasmine Pang M.D.   On: 01/01/2021 20:45    Anti-infectives: Anti-infectives (From admission, onward)  Start     Dose/Rate Route Frequency Ordered Stop   12/31/20 1030  metroNIDAZOLE (FLAGYL) IVPB 500 mg        500 mg 100 mL/hr over 60 Minutes Intravenous Every 12 hours 12/31/20 1006     12/31/20 1030  cefTRIAXone  (ROCEPHIN) 2 g in sodium chloride 0.9 % 100 mL IVPB        2 g 200 mL/hr over 30 Minutes Intravenous Every 24 hours 12/31/20 1011     12/31/20 1015  cefTRIAXone (ROCEPHIN) 1 g in sodium chloride 0.9 % 100 mL IVPB  Status:  Discontinued        1 g 200 mL/hr over 30 Minutes Intravenous Every 24 hours 12/31/20 1006 12/31/20 1011       Impression: Follow-up KUB shows contrast in the colon.  Patient's bowel function has returned.  We will remove gastric tube and start full liquid diet.  May advance as tolerated.  No need for surgical intervention at this time.  LOS: 4 days    Franky Macho 01/03/2021

## 2021-01-04 DIAGNOSIS — K56609 Unspecified intestinal obstruction, unspecified as to partial versus complete obstruction: Secondary | ICD-10-CM | POA: Diagnosis not present

## 2021-01-04 LAB — CBC
HCT: 33.6 % — ABNORMAL LOW (ref 36.0–46.0)
Hemoglobin: 11.3 g/dL — ABNORMAL LOW (ref 12.0–15.0)
MCH: 30.1 pg (ref 26.0–34.0)
MCHC: 33.6 g/dL (ref 30.0–36.0)
MCV: 89.4 fL (ref 80.0–100.0)
Platelets: 361 10*3/uL (ref 150–400)
RBC: 3.76 MIL/uL — ABNORMAL LOW (ref 3.87–5.11)
RDW: 13.5 % (ref 11.5–15.5)
WBC: 8.7 10*3/uL (ref 4.0–10.5)
nRBC: 0 % (ref 0.0–0.2)

## 2021-01-04 LAB — BASIC METABOLIC PANEL
Anion gap: 8 (ref 5–15)
BUN: 5 mg/dL — ABNORMAL LOW (ref 8–23)
CO2: 26 mmol/L (ref 22–32)
Calcium: 9.3 mg/dL (ref 8.9–10.3)
Chloride: 105 mmol/L (ref 98–111)
Creatinine, Ser: 0.68 mg/dL (ref 0.44–1.00)
GFR, Estimated: >60 mL/min (ref >60)
Glucose, Bld: 99 mg/dL (ref 70–99)
Potassium: 4.8 mmol/L (ref 3.5–5.1)
Sodium: 139 mmol/L (ref 135–145)

## 2021-01-04 LAB — MAGNESIUM: Magnesium: 1.9 mg/dL (ref 1.7–2.4)

## 2021-01-04 MED ORDER — CIPROFLOXACIN HCL 500 MG PO TABS: 500.0000 mg | ORAL_TABLET | Freq: Two times a day (BID) | ORAL | 0 refills | Status: AC

## 2021-01-04 MED ORDER — METRONIDAZOLE 500 MG PO TABS: 500.0000 mg | ORAL_TABLET | Freq: Two times a day (BID) | ORAL | 0 refills | Status: AC

## 2021-01-04 NOTE — Progress Notes (Signed)
Subjective: Is tolerating regular diet well.  Has had multiple bowel movements.  Denies any abdominal pain.  Objective: Vital signs in last 24 hours: Temp:  [98.4 F (36.9 C)-99.7 F (37.6 C)] 99.7 F (37.6 C) (12/27 0512) Pulse Rate:  [73-75] 75 (12/27 0512) Resp:  [19-20] 19 (12/27 0512) BP: (129-145)/(49-68) 145/54 (12/27 0512) SpO2:  [95 %-100 %] 95 % (12/27 0512) Last BM Date: 01/04/21  Intake/Output from previous day: 12/26 0701 - 12/27 0700 In: 1073.5 [P.O.:560; IV Piggyback:513.5] Out: 2 [Urine:1; Stool:1] Intake/Output this shift: No intake/output data recorded.  General appearance: alert, cooperative, and no distress GI: soft, non-tender; bowel sounds normal; no masses,  no organomegaly  Lab Results:  Recent Labs    01/03/21 0440 01/04/21 0539  WBC 9.8 8.7  HGB 11.1* 11.3*  HCT 33.5* 33.6*  PLT 356 361   BMET Recent Labs    01/03/21 0440 01/04/21 0539  NA 137 139  K 3.0* 4.8  CL 101 105  CO2 23 26  GLUCOSE 116* 99  BUN 6* 5*  CREATININE 0.50 0.68  CALCIUM 9.0 9.3   PT/INR No results for input(s): LABPROT, INR in the last 72 hours.  Studies/Results: DG Abd 1 View  Result Date: 01/03/2021 CLINICAL DATA:  65 year old female with small bowel obstruction suspected on CT Abdomen and Pelvis. No improvement in bowel-gas pattern after 2 days. Status post oral contrast on 01/01/2021. EXAM: ABDOMEN - 1 VIEW COMPARISON:  01/02/2021 and earlier. FINDINGS: Portable AP supine view at 0411 hours. Stable enteric tube. Oral contrast unchanged throughout the large bowel to the distal descending colon. Gas-filled dilated small bowel loops in the left abdomen persist but have slightly regressed since 01/01/2021. Negative lung bases. No definite pneumoperitoneum on this supine view. Stable cholecystectomy clips. Stable visualized osseous structures. IMPRESSION: Partial small bowel obstruction. Slightly improved bowel gas pattern since 01/01/2021. Electronically Signed    By: Odessa Fleming M.D.   On: 01/03/2021 04:57   DG Abd 1 View  Result Date: 01/02/2021 CLINICAL DATA:  Small-bowel obstruction.  24 hour delayed film EXAM: ABDOMEN - 1 VIEW COMPARISON:  01/01/2021 FINDINGS: Enteric tube remains coiled within the stomach. Persistently dilated loops of small bowel within the abdomen, similar in caliber to the previous exam. Interval progression of oral contrast, now seen throughout the colon. No extraluminal contrast collections. No gross free intraperitoneal air on AP supine view. IMPRESSION: Persistently dilated loops of small bowel within the abdomen, similar in caliber to the previous exam. Interval progression of oral contrast, now seen throughout the colon, compatible with partial or intermittent small bowel obstruction. Electronically Signed   By: Duanne Guess D.O.   On: 01/02/2021 13:02    Anti-infectives: Anti-infectives (From admission, onward)    Start     Dose/Rate Route Frequency Ordered Stop   12/31/20 1030  metroNIDAZOLE (FLAGYL) IVPB 500 mg        500 mg 100 mL/hr over 60 Minutes Intravenous Every 12 hours 12/31/20 1006     12/31/20 1030  cefTRIAXone (ROCEPHIN) 2 g in sodium chloride 0.9 % 100 mL IVPB        2 g 200 mL/hr over 30 Minutes Intravenous Every 24 hours 12/31/20 1011     12/31/20 1015  cefTRIAXone (ROCEPHIN) 1 g in sodium chloride 0.9 % 100 mL IVPB  Status:  Discontinued        1 g 200 mL/hr over 30 Minutes Intravenous Every 24 hours 12/31/20 1006 12/31/20 1011  Assessment/Plan: Impression: Small bowel obstruction, resolved Plan: Okay for discharge from surgery standpoint.  Follow-up with me as needed.  LOS: 5 days    Samantha Huynh 01/04/2021

## 2021-01-04 NOTE — Discharge Summary (Signed)
Physician Discharge Summary  Samantha Huynh:[id number] DOB: Nov 03, 1955 DOA: 12/30/2020  PCP: Kari Baars, MD  Admit date: 12/30/2020 Discharge date: 01/04/2021  Admitted From: Home Disposition: Home  Recommendations for Outpatient Follow-up:  Follow up with PCP in 1-2 weeks Please obtain BMP/CBC in one week your next doctors visit.  Cipro and Flagyl for 3 more days to complete 7-day course   Discharge Condition: Stable CODE STATUS: Full Diet recommendation: 2 g Na  Brief/Interim Summary: -year-old with history of ADHD, IBS, cholecystectomy, hysterectomy, manic depressive disorder comes to the hospital with complaints of abdominal pain that started day prior to admission.  CT in the ED was worrisome for nonspecific enteritis and high-grade bowel obstruction.  NG tube was placed, general surgery was consulted.  Patient small bowel obstruction persist despite of small bowel follow-through; and the following day it started resolving.  Patient is now tolerating oral diet without any issues.  She is stable for discharge with recommendations as stated above.  Advised to complete antibiotic course.     Assessment & Plan:   Principal Problem:   SBO (small bowel obstruction) (HCC) Active Problems:   Depression   Hypokalemia   Accelerated hypertension   Leukocytosis   High-grade small bowel obstruction Nonspecific enteritis - Likely from multiple surgeries in the past, this improved with conservative management with NG tube.  Over several days her symptoms resolved.  She was seen by general surgery.  Tolerating orals.   Hypokalemia - Resolved   Leukocytosis, resolved Nonspecific enteritis - Improved.  Questionable gastritis.  No diarrhea.  3 more days of oral Cipro and Flagyl to complete 7-day course   Urinary tract infection - Urine cultures-growing multiple species.  Empiric IV Rocephin, transition to Cipro on discharge.   Essential hypertension - Resume home regimen    Depression - Resume home regimen   Tobacco use - Counseled to quit using this      There is no height or weight on file to calculate BMI.         Discharge Diagnoses:  Principal Problem:   SBO (small bowel obstruction) (HCC) Active Problems:   Depression   Hypokalemia   Accelerated hypertension   Leukocytosis      Consultations: General surgery  Subjective: Feels great ambulating tolerating orals without any issues.  Wishes to go home.  Discharge Exam: Vitals:   01/03/21 2017 01/04/21 0512  BP: 129/68 (!) 145/54  Pulse: 73 75  Resp: 19 19  Temp: 99 F (37.2 C) 99.7 F (37.6 C)  SpO2: 100% 95%   Vitals:   01/03/21 0435 01/03/21 1349 01/03/21 2017 01/04/21 0512  BP: 133/62 (!) 139/49 129/68 (!) 145/54  Pulse: 65 73 73 75  Resp: Temp: 99.7 F (37.6 C) 98.4 F (36.9 C) 99 F (37.2 C) 99.7 F (37.6 C)  TempSrc: Oral Oral Oral   SpO2: 96% 100% 100% 95%    General: Pt is alert, awake, not in acute distress Cardiovascular: RRR, S1/S2 +, no rubs, no gallops Respiratory: CTA bilaterally, no wheezing, no rhonchi Abdominal: Soft, NT, ND, bowel sounds + Extremities: no edema, no cyanosis  Discharge Instructions   Allergies as of 01/04/2021   No Known Allergies      Medication List     STOP taking these medications    cephALEXin 250 MG capsule Commonly known as: KEFLEX   doxycycline 100 MG capsule Commonly known as: VIBRAMYCIN   ibuprofen 800 MG tablet Commonly known as: ADVIL  traMADol 50 MG tablet Commonly known as: ULTRAM       TAKE these medications    ciprofloxacin 500 MG tablet Commonly known as: Cipro Take 1 tablet (500 mg total) by mouth 2 (two) times daily for 3 days.   dicyclomine 20 MG tablet Commonly known as: BENTYL Take 20 mg by mouth 4 (four) times daily.   escitalopram 20 MG tablet Commonly known as: LEXAPRO Take 20 mg by mouth daily.   lisinopril 20 MG tablet Commonly known as:  ZESTRIL Take 20 mg by mouth daily.   metroNIDAZOLE 500 MG tablet Commonly known as: Flagyl Take 1 tablet (500 mg total) by mouth 2 (two) times daily for 3 days.        Follow-up Information     Franky Macho, MD Follow up.   Specialty: General Surgery Why: As needed Contact information: [address] [place] [address] [phone]         Kari Baars, MD Follow up in 1 week(s).   Specialty: Pulmonary Disease Contact information: [address] [place] [address] [phone]                No Known Allergies  You were cared for by a hospitalist during your hospital stay. If you have any questions about your discharge medications or the care you received while you were in the hospital after you are discharged, you can call the unit and asked to speak with the hospitalist on call if the hospitalist that took care of you is not available. Once you are discharged, your primary care physician will handle any further medical issues. Please note that no refills for any discharge medications will be authorized once you are discharged, as it is imperative that you return to your primary care physician (or establish a relationship with a primary care physician if you do not have one) for your aftercare needs so that they can reassess your need for medications and monitor your lab values.   Procedures/Studies: DG Abd 1 View  Result Date: 01/03/2021 CLINICAL DATA:  65 year old female with small bowel obstruction suspected on CT Abdomen and Pelvis. No improvement in bowel-gas pattern after 2 days. Status post oral contrast on 01/01/2021. EXAM: ABDOMEN - 1 VIEW COMPARISON:  01/02/2021 and earlier. FINDINGS: Portable AP supine view at 0411 hours. Stable enteric tube. Oral contrast unchanged throughout the large bowel to the distal descending colon. Gas-filled dilated small bowel loops in the left abdomen persist but have slightly regressed since 01/01/2021.  Negative lung bases. No definite pneumoperitoneum on this supine view. Stable cholecystectomy clips. Stable visualized osseous structures. IMPRESSION: Partial small bowel obstruction. Slightly improved bowel gas pattern since 01/01/2021. Electronically Signed   By: Odessa Fleming M.D.   On: 01/03/2021 04:57   DG Abd 1 View  Result Date: 01/02/2021 CLINICAL DATA:  Small-bowel obstruction.  24 hour delayed film EXAM: ABDOMEN - 1 VIEW COMPARISON:  01/01/2021 FINDINGS: Enteric tube remains coiled within the stomach. Persistently dilated loops of small bowel within the abdomen, similar in caliber to the previous exam. Interval progression of oral contrast, now seen throughout the colon. No extraluminal contrast collections. No gross free intraperitoneal air on AP supine view. IMPRESSION: Persistently dilated loops of small bowel within the abdomen, similar in caliber to the previous exam. Interval progression of oral contrast, now seen throughout the colon, compatible with partial or intermittent small bowel obstruction. Electronically Signed   By: Duanne Guess D.O.   On: 01/02/2021 13:02   DG Abd  1 View  Result Date: 01/01/2021 CLINICAL DATA:  65 year old female with abdominal distension and pain. Small-bowel obstruction suspected on recent CT Abdomen and Pelvis. EXAM: ABDOMEN - 1 VIEW COMPARISON:  12/31/2020 and earlier. FINDINGS: Portable AP supine view at 0947 hours. Continued gas dilated small bowel loops in the mid abdomen. Enteric tube in place, side hole at the level of the gastric body. Negative visible left lung base. Stable cholecystectomy clips. No pneumoperitoneum identified on this supine view. On going paucity of large bowel gas. Pelvic phleboliths. No acute osseous abnormality identified. IMPRESSION: 1. Small-bowel obstruction, gas pattern not improved since 12/31/2019. 2. Stable enteric tube terminating in the stomach. Electronically Signed   By: Odessa Fleming M.D.   On: 01/01/2021 10:04   Abd 1 View  (KUB)  Result Date: 12/31/2020 CLINICAL DATA:  Lower abdominal pain.  SBO. EXAM: ABDOMEN - 1 VIEW COMPARISON:  KUB and CT AP, 12/30/2020. FINDINGS: Support lines: NG tube, incompletely imaged. Multiple air-and-fluid distended loops of small bowel within the central abdomen. Nondilated colon. No radiographic findings of intraperitoneal gas. Contrast layering within the urinary bladder. Cholecystectomy clips. Degenerative changes of spine. No interval osseous abnormality. IMPRESSION: Persistent findings of small-bowel obstruction. Electronically Signed   By: Roanna Banning M.D.   On: 12/31/2020 07:54   DG Abdomen 1 View  Result Date: 12/31/2020 CLINICAL DATA:  Check gastric catheter placement EXAM: ABDOMEN - 1 VIEW COMPARISON:  None. FINDINGS: Gastric catheter is noted within the stomach. Cardiac shadow is within normal limits. Contrast is noted within the collecting systems from recent CT. IMPRESSION: Gastric catheter within the stomach. Electronically Signed   By: Alcide Clever M.D.   On: 12/31/2020 00:07   CT Abdomen Pelvis W Contrast  Result Date: 12/30/2020 CLINICAL DATA:  Acute abdominal pain. EXAM: CT ABDOMEN AND PELVIS WITH CONTRAST TECHNIQUE: Multidetector CT imaging of the abdomen and pelvis was performed using the standard protocol following bolus administration of intravenous contrast. CONTRAST:  OMNIPAQUE IOHEXOL 300 MG/ML  SOLN COMPARISON:  Ultrasound abdomen 06/18/2012. FINDINGS: Lower chest: No acute abnormality. Hepatobiliary: There is a rounded hypodensity in the liver which is too small to characterize, likely a small hemangioma near the gallbladder fossa. No other liver lesions are identified. The gallbladder is surgically absent. There is no biliary ductal dilatation. Pancreas: There are punctate pancreatic calcifications likely related to chronic pancreatitis. There are no acute inflammatory changes. There is no pancreatic ductal dilatation. Spleen: Normal in size without focal  abnormality. Adrenals/Urinary Tract: There is no hydronephrosis or perinephric fluid. There is cortical scarring in the superior pole the left kidney. The adrenal glands and bladder are within normal limits. Stomach/Bowel: There is a short length segment small bowel loop with wall thickening and inflammation in the pelvis image 2/71. This is transition point for small bowel obstruction seen on image 2/65. Small bowel loops proximal to this level are dilated with air-fluid levels measuring up to 4 cm in diameter. There is mild associated mesenteric edema. There is no definite pneumatosis or free air. Additionally, the stomach is dilated with air-fluid level. Colon is decompressed. There are few scattered colonic diverticula. The appendix is within normal limits. Vascular/Lymphatic: Aortic atherosclerosis. No enlarged abdominal or pelvic lymph nodes. Reproductive: Status post hysterectomy. No adnexal masses. Other: There is a small amount of fluid adjacent to the distal esophagus, nonspecific. 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 Musculoskeletal: Degenerative changes affect the spine. IMPRESSION: 1. Small bowel wall thickening in the pelvis with surrounding  inflammation worrisome for nonspecific enteritis. This is transition point for high-grade small bowel obstruction. There may be stricture at this level given CT appearance. Mesenteric edema is present which can be seen with vascular compromise. The stomach is also dilated. 2.  Other nonacute findings as above. Electronically Signed   By: Darliss Cheney M.D.   On: 12/30/2020 22:46   DG Abd Portable 1V-Small Bowel Obstruction Protocol-initial, 8 hr delay  Result Date: 01/01/2021 CLINICAL DATA:  8 hour delay bowel obstruction EXAM: PORTABLE ABDOMEN - 1 VIEW COMPARISON:  01/01/2021, 12/31/2020, 12/30/2020 FINDINGS: Esophageal tube tip and side port overlie the proximal stomach. Dilute contrast present within dilated small bowel, small bowel  loops distended up to 3.5 cm. Possible small amount of dilute contrast in right colon, no contrast visible in the rectum. IMPRESSION: 1. Persistent small bowel distension with dilute contrast mostly within dilated small bowel. Possible small amount of dilute contrast in right colon. No convincing contrast is visualized in the rectum and 24 hour delayed image may be acquired as per history notes. Electronically Signed   By: Jasmine Pang M.D.   On: 01/01/2021 20:45     The results of significant diagnostics from this hospitalization (including imaging, microbiology, ancillary and laboratory) are listed below for reference.     Microbiology: Recent Results (from the past 240 hour(s))  Resp Panel by RT-PCR (Flu A&B, Covid) Nasopharyngeal Swab     Status: None   Collection Time: 12/30/20 11:30 PM   Specimen: Nasopharyngeal Swab; Nasopharyngeal(NP) swabs in vial transport medium  Result Value Ref Range Status   SARS Coronavirus 2 by RT PCR NEGATIVE NEGATIVE Final    Comment: (NOTE) SARS-CoV-2 target nucleic acids are NOT DETECTED.  The SARS-CoV-2 RNA is generally detectable in upper respiratory specimens during the acute phase of infection. The lowest concentration of SARS-CoV-2 viral copies this assay can detect is 138 copies/mL. A negative result does not preclude SARS-Cov-2 infection and should not be used as the sole basis for treatment or other patient management decisions. A negative result may occur with  improper specimen collection/handling, submission of specimen other than nasopharyngeal swab, presence of viral mutation(s) within the areas targeted by this assay, and inadequate number of viral copies(<138 copies/mL). A negative result must be combined with clinical observations, patient history, and epidemiological information. The expected result is Negative.  Fact Sheet for Patients:  BloggerCourse.com  Fact Sheet for Healthcare Providers:   SeriousBroker.it  This test is no t yet approved or cleared by the [place] FDA and  has been authorized for detection and/or diagnosis of SARS-CoV-2 by FDA under an Emergency Use Authorization (EUA). This EUA will remain  in effect (meaning this test can be used) for the duration of the COVID-19 declaration under Section 564(b)(1) of the Act, 21 U.S.C.section 360bbb-3(b)(1), unless the authorization is terminated  or revoked sooner.       Influenza A by PCR NEGATIVE NEGATIVE Final   Influenza B by PCR NEGATIVE NEGATIVE Final    Comment: (NOTE) The Xpert Xpress SARS-CoV-2/FLU/RSV plus assay is intended as an aid in the diagnosis of influenza from Nasopharyngeal swab specimens and should not be used as a sole basis for treatment. Nasal washings and aspirates are unacceptable for Xpert Xpress SARS-CoV-2/FLU/RSV testing.  Fact Sheet for Patients: BloggerCourse.com  Fact Sheet for Healthcare Providers: SeriousBroker.it  This test is not yet approved or cleared by the [place] FDA and has been authorized for detection and/or diagnosis of SARS-CoV-2 by FDA under an  Emergency Use Authorization (EUA). This EUA will remain in effect (meaning this test can be used) for the duration of the COVID-19 declaration under Section 564(b)(1) of the Act, 21 U.S.C. section 360bbb-3(b)(1), unless the authorization is terminated or revoked.  Performed at [facility], [address], [place], [place] [address]   Urine Culture     Status: Abnormal   Collection Time: 12/31/20  5:20 AM   Specimen: Urine, Clean Catch  Result Value Ref Range Status   Specimen Description   Final    URINE, CLEAN CATCH Performed at [facility], [address] [place], [place] [address]    Special Requests   Final    NONE Performed at [facility], [address], [place], [place] [address]    Culture MULTIPLE SPECIES PRESENT,  SUGGEST RECOLLECTION (A)  Final   Report Status 01/02/2021 FINAL  Final  Culture, blood (Routine X 2) w Reflex to ID Panel     Status: None (Preliminary result)   Collection Time: 12/31/20 10:12 AM   Specimen: Right Antecubital; Blood  Result Value Ref Range Status   Specimen Description RIGHT ANTECUBITAL  Final   Special Requests   Final    BOTTLES DRAWN AEROBIC AND ANAEROBIC Blood Culture adequate volume   Culture   Final    NO GROWTH 3 DAYS Performed at [facility], [address], [place], [place] [address]    Report Status PENDING  Incomplete  Culture, blood (Routine X 2) w Reflex to ID Panel     Status: None (Preliminary result)   Collection Time: 12/31/20 10:23 AM   Specimen: Left Antecubital; Blood  Result Value Ref Range Status   Specimen Description LEFT ANTECUBITAL  Final   Special Requests   Final    BOTTLES DRAWN AEROBIC AND ANAEROBIC Blood Culture adequate volume   Culture   Final    NO GROWTH 3 DAYS Performed at [facility], [address], [place], [place] [address]    Report Status PENDING  Incomplete     Labs: BNP (last 3 results) No results for input(s): BNP in the last 8760 hours. Basic Metabolic Panel: Recent Labs  Lab 12/31/20 0018 01/01/21 0518 01/02/21 0437 01/03/21 0440 01/04/21 0539  NA 135 137 139 137 139  K 4.1 4.1 4.9 3.0* 4.8  CL 96* 103 103 101 105  CO2 26 27 26 23 26   GLUCOSE 200* 139* 107* 116* 99  BUN 16 12 8  6* 5*  CREATININE 0.88 0.65 0.67 0.50 0.68  CALCIUM 11.1* 9.1 9.3 9.0 9.3  MG 2.0 1.9 1.9 1.8 1.9   Liver Function Tests: Recent Labs  Lab 12/30/20 1952 12/31/20 0018 01/01/21 0518 01/02/21 0437 01/03/21 0440  AST 22 28 28 26 24   ALT 18 23 24 25 23   ALKPHOS 88 80 57 53 49  BILITOT 0.8 0.6 0.4 0.4 0.3  PROT 8.9* 8.3* 6.5 6.3* 5.9*  ALBUMIN 5.0 4.6 3.5 3.4* 3.2*   Recent Labs  Lab 12/30/20 1952  LIPASE 25   No results for input(s): AMMONIA in the last 168 hours. CBC: Recent Labs  Lab 12/30/20 1952  12/31/20 0018 01/01/21 0518 01/02/21 0437 01/03/21 0440 01/04/21 0539  WBC 19.6* 22.4* 8.5 9.4 9.8 8.7  NEUTROABS 16.9* 20.2*  --   --   --   --   HGB 15.4* 14.8 13.0 11.9* 11.1* 11.3*  HCT 47.1* 45.2 39.4 35.8* 33.5* 33.6*  MCV 90.4 90.0 90.6 91.6 88.6 89.4  PLT 448* 551* 455* 388 356  361   Cardiac Enzymes: No results for input(s): CKTOTAL, CKMB, CKMBINDEX, TROPONINI in the last 168 hours. BNP: Invalid input(s): POCBNP CBG: Recent Labs  Lab 12/30/20 1853  GLUCAP 229*   D-Dimer No results for input(s): DDIMER in the last 72 hours. Hgb A1c No results for input(s): HGBA1C in the last 72 hours. Lipid Profile No results for input(s): CHOL, HDL, LDLCALC, TRIG, CHOLHDL, LDLDIRECT in the last 72 hours. Thyroid function studies No results for input(s): TSH, T4TOTAL, T3FREE, THYROIDAB in the last 72 hours.  Invalid input(s): FREET3 Anemia work up No results for input(s): VITAMINB12, FOLATE, FERRITIN, TIBC, IRON, RETICCTPCT in the last 72 hours. Urinalysis    Component Value Date/Time   COLORURINE YELLOW 12/30/2020 0517   APPEARANCEUR CLEAR 12/30/2020 0517   LABSPEC <1.005 (L) 12/30/2020 0517   PHURINE 6.0 12/30/2020 0517   GLUCOSEU NEGATIVE 12/30/2020 0517   HGBUR MODERATE (A) 12/30/2020 0517   BILIRUBINUR NEGATIVE 12/30/2020 0517   KETONESUR NEGATIVE 12/30/2020 0517   PROTEINUR TRACE (A) 12/30/2020 0517   NITRITE NEGATIVE 12/30/2020 0517   LEUKOCYTESUR NEGATIVE 12/30/2020 0517   Sepsis Labs Invalid input(s): PROCALCITONIN,  WBC,  LACTICIDVEN Microbiology Recent Results (from the past 240 hour(s))  Resp Panel by RT-PCR (Flu A&B, Covid) Nasopharyngeal Swab     Status: None   Collection Time: 12/30/20 11:30 PM   Specimen: Nasopharyngeal Swab; Nasopharyngeal(NP) swabs in vial transport medium  Result Value Ref Range Status   SARS Coronavirus 2 by RT PCR NEGATIVE NEGATIVE Final    Comment: (NOTE) SARS-CoV-2 target nucleic acids are NOT DETECTED.  The SARS-CoV-2 RNA is  generally detectable in upper respiratory specimens during the acute phase of infection. The lowest concentration of SARS-CoV-2 viral copies this assay can detect is 138 copies/mL. A negative result does not preclude SARS-Cov-2 infection and should not be used as the sole basis for treatment or other patient management decisions. A negative result may occur with  improper specimen collection/handling, submission of specimen other than nasopharyngeal swab, presence of viral mutation(s) within the areas targeted by this assay, and inadequate number of viral copies(<138 copies/mL). A negative result must be combined with clinical observations, patient history, and epidemiological information. The expected result is Negative.  Fact Sheet for Patients:  BloggerCourse.com  Fact Sheet for Healthcare Providers:  SeriousBroker.it  This test is no t yet approved or cleared by the [place] FDA and  has been authorized for detection and/or diagnosis of SARS-CoV-2 by FDA under an Emergency Use Authorization (EUA). This EUA will remain  in effect (meaning this test can be used) for the duration of the COVID-19 declaration under Section 564(b)(1) of the Act, 21 U.S.C.section 360bbb-3(b)(1), unless the authorization is terminated  or revoked sooner.       Influenza A by PCR NEGATIVE NEGATIVE Final   Influenza B by PCR NEGATIVE NEGATIVE Final    Comment: (NOTE) The Xpert Xpress SARS-CoV-2/FLU/RSV plus assay is intended as an aid in the diagnosis of influenza from Nasopharyngeal swab specimens and should not be used as a sole basis for treatment. Nasal washings and aspirates are unacceptable for Xpert Xpress SARS-CoV-2/FLU/RSV testing.  Fact Sheet for Patients: BloggerCourse.com  Fact Sheet for Healthcare Providers: SeriousBroker.it  This test is not yet approved or cleared by the [place] FDA and has been authorized for detection and/or diagnosis of SARS-CoV-2 by FDA under an Emergency Use Authorization (EUA). This EUA will remain in effect (meaning this test can be used) for the duration of the COVID-19  declaration under Section 564(b)(1) of the Act, 21 U.S.C. section 360bbb-3(b)(1), unless the authorization is terminated or revoked.  Performed at [facility], [address] [place], [place] [address]   Urine Culture     Status: Abnormal   Collection Time: 12/31/20  5:20 AM   Specimen: Urine, Clean Catch  Result Value Ref Range Status   Specimen Description   Final    URINE, CLEAN CATCH Performed at [facility], [address] [place], [place] [address]    Special Requests   Final    NONE Performed at [facility], [address] [place], [place] [address]    Culture MULTIPLE SPECIES PRESENT, SUGGEST RECOLLECTION (A)  Final   Report Status 01/02/2021 FINAL  Final  Culture, blood (Routine X 2) w Reflex to ID Panel     Status: None (Preliminary result)   Collection Time: 12/31/20 10:12 AM   Specimen: Right Antecubital; Blood  Result Value Ref Range Status   Specimen Description RIGHT ANTECUBITAL  Final   Special Requests   Final    BOTTLES DRAWN AEROBIC AND ANAEROBIC Blood Culture adequate volume   Culture   Final    NO GROWTH 3 DAYS Performed at [facility], [address] [place], [place] [address]    Report Status PENDING  Incomplete  Culture, blood (Routine X 2) w Reflex to ID Panel     Status: None (Preliminary result)   Collection Time: 12/31/20 10:23 AM   Specimen: Left Antecubital; Blood  Result Value Ref Range Status   Specimen Description LEFT ANTECUBITAL  Final   Special Requests   Final    BOTTLES DRAWN AEROBIC AND ANAEROBIC Blood Culture adequate volume   Culture   Final    NO GROWTH 3 DAYS Performed at [facility], [address] [place], [place] [address]    Report Status PENDING  Incomplete     Time coordinating discharge:   I have spent 35 minutes face to face with the patient and on the ward discussing the patients care, assessment, plan and disposition with other care givers. >50% of the time was devoted counseling the patient about the risks and benefits of treatment/Discharge disposition and coordinating care.   SIGNED:   Dimple Nanas, MD  [facility] 01/04/2021, 11:17 AM   If 7PM-7AM, please contact night-coverage

## 2021-01-05 LAB — CULTURE, BLOOD (ROUTINE X 2)
Culture: NO GROWTH
Culture: NO GROWTH
Special Requests: ADEQUATE
Special Requests: ADEQUATE

## 2021-02-08 ENCOUNTER — Ambulatory Visit: Payer: Medicaid Other | Admitting: Nurse Practitioner

## 2023-03-30 ENCOUNTER — Emergency Department (HOSPITAL_COMMUNITY)

## 2023-03-30 ENCOUNTER — Other Ambulatory Visit: Payer: Self-pay

## 2023-03-30 ENCOUNTER — Encounter (HOSPITAL_COMMUNITY): Payer: Self-pay | Admitting: *Deleted

## 2023-03-30 ENCOUNTER — Emergency Department (HOSPITAL_COMMUNITY)
Admission: EM | Admit: 2023-03-30 | Discharge: 2023-03-30 | Discharge status: Against Medical Advice | Attending: Emergency Medicine | Admitting: Emergency Medicine

## 2023-03-30 DIAGNOSIS — R1013 Epigastric pain: Secondary | ICD-10-CM | POA: Insufficient documentation

## 2023-03-30 DIAGNOSIS — E871 Hypo-osmolality and hyponatremia: Secondary | ICD-10-CM | POA: Diagnosis not present

## 2023-03-30 DIAGNOSIS — I10 Essential (primary) hypertension: Secondary | ICD-10-CM | POA: Insufficient documentation

## 2023-03-30 LAB — CBC
HCT: 45.5 % (ref 36.0–46.0)
Hemoglobin: 14.8 g/dL (ref 12.0–15.0)
MCH: 29.7 pg (ref 26.0–34.0)
MCHC: 32.5 g/dL (ref 30.0–36.0)
MCV: 91.2 fL (ref 80.0–100.0)
Platelets: 339 10*3/uL (ref 150–400)
RBC: 4.99 MIL/uL (ref 3.87–5.11)
RDW: 14.4 % (ref 11.5–15.5)
WBC: 7.7 10*3/uL (ref 4.0–10.5)
nRBC: 0 % (ref 0.0–0.2)

## 2023-03-30 LAB — COMPREHENSIVE METABOLIC PANEL
ALT: 24 U/L (ref 0–44)
AST: 33 U/L (ref 15–41)
Albumin: 3.9 g/dL (ref 3.5–5.0)
Alkaline Phosphatase: 68 U/L (ref 38–126)
Anion gap: 9 (ref 5–15)
BUN: 12 mg/dL (ref 8–23)
CO2: 25 mmol/L (ref 22–32)
Calcium: 9.9 mg/dL (ref 8.9–10.3)
Chloride: 97 mmol/L — ABNORMAL LOW (ref 98–111)
Creatinine, Ser: 0.72 mg/dL (ref 0.44–1.00)
GFR, Estimated: >60 mL/min (ref >60)
Glucose, Bld: 86 mg/dL (ref 70–99)
Potassium: 4 mmol/L (ref 3.5–5.1)
Sodium: 131 mmol/L — ABNORMAL LOW (ref 135–145)
Total Bilirubin: 0.5 mg/dL (ref 0.0–1.2)
Total Protein: 7.1 g/dL (ref 6.5–8.1)

## 2023-03-30 LAB — LIPASE, BLOOD: Lipase: 27 U/L (ref 11–51)

## 2023-03-30 MED ORDER — IOHEXOL 300 MG/ML  SOLN
100.0000 mL | Freq: Once | INTRAMUSCULAR | Status: AC | PRN
  Administered 2023-03-30: 75 mL via INTRAVENOUS

## 2023-03-30 NOTE — ED Triage Notes (Signed)
 Pt with abd pain, states firm to mid upper. denies any N/V/D.

## 2023-03-30 NOTE — ED Notes (Signed)
 Pt requested IV to be removed and stated she had to leave, her ride had to go to work.  Pt not able to stay for CT results. IV removed and pt ambulated out of the dept with steady gait.  EDP made aware of pt's leaving.

## 2023-03-30 NOTE — ED Provider Notes (Signed)
 [place] [facility] AT [facility] Provider Note   CSN: [id number] Arrival date & time: 03/30/23  1205     History  Chief Complaint  Patient presents with   Abdominal Pain    Samantha Huynh is a 68 y.o. female.  She has PMH of depression, hypertension, small bowel obstruction.  Presents ER for epigastric pain with a bulge in the epigastric area x 3 weeks.  Seen at [facility] initially and given GI cocktail and discharged home.  She states they told her she likely had a hernia as well causing the bulge in her abdomen.  She was prescribed a PPI but states she did not take this at all.  She has had continued symptoms, denies vomiting.  She states she wanted to make sure she did not have another obstruction.   Surgical history of a cholecystectomy and abdominal hysterectomy  Abdominal Pain      Home Medications Prior to Admission medications   Medication Sig Start Date End Date Taking? Authorizing Provider  dicyclomine (BENTYL) 20 MG tablet Take 20 mg by mouth 4 (four) times daily. 12/30/20   [provider]  escitalopram (LEXAPRO) 20 MG tablet Take 20 mg by mouth daily. 11/23/20   [provider]  lisinopril (ZESTRIL) 20 MG tablet Take 20 mg by mouth daily. 07/05/18   [provider]      Allergies    Patient has no known allergies.    Review of Systems   Review of Systems  Gastrointestinal:  Positive for abdominal pain.    Physical Exam Updated Vital Signs BP (!) 164/82 (BP Location: Right Arm)   Pulse 88   Temp 99 F (37.2 C) (Oral)   Resp 20   Ht 5\' 2"  (1.575 m)   Wt 56.7 kg   SpO2 98%   BMI 22.86 kg/m  Physical Exam Vitals and nursing note reviewed.  Constitutional:      General: She is not in acute distress.    Appearance: She is well-developed.  HENT:     Head: Normocephalic and atraumatic.  Eyes:     Extraocular Movements: Extraocular movements intact.     Conjunctiva/sclera: Conjunctivae normal.      Pupils: Pupils are equal, round, and reactive to light.  Cardiovascular:     Rate and Rhythm: Normal rate and regular rhythm.     Heart sounds: No murmur heard. Pulmonary:     Effort: Pulmonary effort is normal. No respiratory distress.     Breath sounds: Normal breath sounds.  Abdominal:     Palpations: Abdomen is soft.     Tenderness: There is abdominal tenderness in the epigastric area.     Hernia: A hernia is present. Hernia is present in the ventral area.  Musculoskeletal:        General: No swelling.     Cervical back: Neck supple.  Skin:    General: Skin is warm and dry.     Capillary Refill: Capillary refill takes less than 2 seconds.  Neurological:     General: No focal deficit present.     Mental Status: She is alert.  Psychiatric:        Mood and Affect: Mood normal.     ED Results / Procedures / Treatments   Labs (all labs ordered are listed, but only abnormal results are displayed) Labs Reviewed  COMPREHENSIVE METABOLIC PANEL - Abnormal; Notable for the following components:      Result Value   Sodium 131 (*)  Chloride 97 (*)    All other components within normal limits  LIPASE, BLOOD  CBC    EKG None  Radiology CT ABDOMEN PELVIS W CONTRAST Result Date: 03/30/2023 CLINICAL DATA:  Firmness to the midabdomen with abdominal pain EXAM: CT ABDOMEN AND PELVIS WITH CONTRAST TECHNIQUE: Multidetector CT imaging of the abdomen and pelvis was performed using the standard protocol following bolus administration of intravenous contrast. RADIATION DOSE REDUCTION: This exam was performed according to the departmental dose-optimization program which includes automated exposure control, adjustment of the mA and/or kV according to patient size and/or use of iterative reconstruction technique. CONTRAST:  75mL OMNIPAQUE IOHEXOL 300 MG/ML  SOLN COMPARISON:  CT abdomen and pelvis dated 12/30/2020 FINDINGS: Lower chest: No focal consolidation or pulmonary nodule in the lung bases.  No pleural effusion or pneumothorax demonstrated. Partially imaged heart size is normal. Hepatobiliary: No focal hepatic lesions. No intra or extrahepatic biliary ductal dilation. Cholecystectomy. Pancreas: No focal mass or main ductal dilation. Punctate calcifications in the pancreas may reflect sequela of prior pancreatitis. Spleen: Normal in size without focal abnormality. Adrenals/Urinary Tract: No adrenal nodules. No suspicious renal mass, calculi or hydronephrosis. No focal bladder wall thickening. Stomach/Bowel: Normal appearance of the stomach. No evidence of bowel wall thickening, distention, or inflammatory changes. Colonic diverticulosis without acute diverticulitis. Normal appendix. Vascular/Lymphatic: Aortic atherosclerosis. No enlarged abdominal or pelvic lymph nodes. Reproductive: No adnexal masses. Other: Trace pelvic free fluid. No free air or fluid collection. Small fat-containing posterior right diaphragmatic hernia. Musculoskeletal: No acute or abnormal lytic or blastic osseous lesions. Multilevel degenerative changes of the partially imaged thoracic and lumbar spine. Increased size of small fat-containing ventral midline epigastric abdominal hernia measuring 4.4 x 1.9 cm (2:23), previously 2.8 x 1.6 cm. Hernia neck measures 1.0 x 0.9 cm. IMPRESSION: 1. Increased size of small fat-containing ventral midline epigastric abdominal hernia as described. 2. Colonic diverticulosis without acute diverticulitis. 3.  Aortic Atherosclerosis (ICD10-I70.0). Electronically Signed   By: Agustin Cree M.D.   On: 03/30/2023 16:35    Procedures Procedures    Medications Ordered in ED Medications  iohexol (OMNIPAQUE) 300 MG/ML solution 100 mL (75 mLs Intravenous Contrast Given 03/30/23 1423)    ED Course/ Medical Decision Making/ A&P                                 Medical Decision Making Diagnosis includes but not limited to gastritis, incarcerated hernia, strangulated hernia, pancreatitis,  cholecystitis, other  Course: Patient is having upper abdominal pain, feels a bulge in her abdomen, was told it was probably a hernia at prior visit and she was treated with PPI which she has not been taking.  That was 3 weeks ago but states symptoms are continuous.  Patient's labs were reviewed, mild hyponatremia but otherwise reassuring, lipase is normal, UA was not collected.  CT ordered and performed but patient left prior to CT read.  I was not able to talk to patient prior to her leaving.  I was informed by the nurse that she had walked out due to her ride leaving.  Amount and/or Complexity of Data Reviewed Labs: ordered. Radiology: ordered.  Risk Prescription drug management.           Final Clinical Impression(s) / ED Diagnoses Final diagnoses:  Epigastric pain    Rx / DC Orders ED Discharge Orders     None         [place], [address]  A, PA-C 03/30/23 1901    Terrilee Files, MD 03/31/23 [phone]

## 2023-03-30 NOTE — ED Notes (Signed)
 Patient transported to CT

## 2024-01-22 ENCOUNTER — Ambulatory Visit: Payer: Medicare (Managed Care) | Admitting: Student in an Organized Health Care Education/Training Program

## 2024-03-10 ENCOUNTER — Ambulatory Visit: Payer: Self-pay | Admitting: Family Medicine
